# Patient Record
Sex: Male | Born: 1995 | Hispanic: No | Marital: Single | Smoking: Never smoker
Health system: Southern US, Community
[De-identification: ages and names within clinical notes are randomized; demographics above are authoritative.]

## PROBLEM LIST (undated history)

## (undated) DIAGNOSIS — F32A Depression, unspecified: Secondary | ICD-10-CM

## (undated) HISTORY — PX: NO PAST SURGERIES: SHX2092

## (undated) HISTORY — DX: Depression, unspecified: F32.A

---
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
## 2016-04-11 ENCOUNTER — Other Ambulatory Visit: Payer: Self-pay | Admitting: Family Medicine

## 2016-04-11 ENCOUNTER — Ambulatory Visit
Admission: RE | Admit: 2016-04-11 | Discharge: 2016-04-11 | Disposition: A | Discharge status: Home / Self Care | Payer: 59 | Source: Ambulatory Visit | Attending: Family Medicine | Admitting: Family Medicine

## 2016-04-11 DIAGNOSIS — S8991XA Unspecified injury of right lower leg, initial encounter: Secondary | ICD-10-CM

## 2018-06-12 ENCOUNTER — Other Ambulatory Visit: Payer: Self-pay

## 2018-06-12 ENCOUNTER — Encounter (HOSPITAL_BASED_OUTPATIENT_CLINIC_OR_DEPARTMENT_OTHER): Payer: Self-pay | Admitting: Emergency Medicine

## 2018-06-12 ENCOUNTER — Emergency Department (HOSPITAL_BASED_OUTPATIENT_CLINIC_OR_DEPARTMENT_OTHER)
Admission: EM | Admit: 2018-06-12 | Discharge: 2018-06-13 | Disposition: A | Discharge status: Home / Self Care | Payer: Managed Care, Other (non HMO) | Attending: Emergency Medicine | Admitting: Emergency Medicine

## 2018-06-12 DIAGNOSIS — R6889 Other general symptoms and signs: Secondary | ICD-10-CM

## 2018-06-12 DIAGNOSIS — R509 Fever, unspecified: Secondary | ICD-10-CM | POA: Diagnosis present

## 2018-06-12 DIAGNOSIS — J111 Influenza due to unidentified influenza virus with other respiratory manifestations: Secondary | ICD-10-CM | POA: Diagnosis not present

## 2018-06-12 NOTE — ED Triage Notes (Signed)
Pt is c/o fever, chills, shortness of breath, nausea, and abd pain  Pt states he did not feel well Monday and Tuesday but it has gotten worse today  Mother took CBG 78 at home  Pt last took Dayquil at noon today

## 2018-06-13 LAB — COMPREHENSIVE METABOLIC PANEL
ALBUMIN: 4.3 g/dL (ref 3.5–5.0)
ALK PHOS: 44 U/L (ref 38–126)
ALT: 21 U/L (ref 0–44)
AST: 18 U/L (ref 15–41)
Anion gap: 10 (ref 5–15)
BUN: 12 mg/dL (ref 6–20)
CHLORIDE: 102 mmol/L (ref 98–111)
CO2: 24 mmol/L (ref 22–32)
CREATININE: 0.86 mg/dL (ref 0.61–1.24)
Calcium: 8.7 mg/dL — ABNORMAL LOW (ref 8.9–10.3)
GFR calc non Af Amer: >60 mL/min (ref >60)
GLUCOSE: 86 mg/dL (ref 70–99)
Potassium: 3.8 mmol/L (ref 3.5–5.1)
SODIUM: 136 mmol/L (ref 135–145)
Total Bilirubin: 0.5 mg/dL (ref 0.3–1.2)
Total Protein: 7.3 g/dL (ref 6.5–8.1)

## 2018-06-13 LAB — CBC WITH DIFFERENTIAL/PLATELET
Abs Immature Granulocytes: 0.02 10*3/uL (ref 0.00–0.07)
BASOS ABS: 0.1 10*3/uL (ref 0.0–0.1)
Basophils Relative: 1 %
EOS ABS: 0.4 10*3/uL (ref 0.0–0.5)
EOS PCT: 4 %
HCT: 43.6 % (ref 39.0–52.0)
Hemoglobin: 14.1 g/dL (ref 13.0–17.0)
Immature Granulocytes: 0 %
Lymphocytes Relative: 29 %
Lymphs Abs: 2.7 10*3/uL (ref 0.7–4.0)
MCH: 30.8 pg (ref 26.0–34.0)
MCHC: 32.3 g/dL (ref 30.0–36.0)
MCV: 95.2 fL (ref 80.0–100.0)
MONO ABS: 0.7 10*3/uL (ref 0.1–1.0)
Monocytes Relative: 8 %
Neutro Abs: 5.4 10*3/uL (ref 1.7–7.7)
Neutrophils Relative %: 58 %
Platelets: 290 10*3/uL (ref 150–400)
RBC: 4.58 MIL/uL (ref 4.22–5.81)
RDW: 12.5 % (ref 11.5–15.5)
WBC: 9.4 10*3/uL (ref 4.0–10.5)
nRBC: 0 % (ref 0.0–0.2)

## 2018-06-13 LAB — LIPASE, BLOOD: Lipase: 27 U/L (ref 11–51)

## 2018-06-13 MED ORDER — ONDANSETRON HCL 4 MG/2ML IJ SOLN
4.0000 mg | Freq: Once | INTRAMUSCULAR | Status: AC
  Administered 2018-06-13: 4 mg via INTRAVENOUS
  Filled 2018-06-13: qty 2

## 2018-06-13 MED ORDER — KETOROLAC TROMETHAMINE 30 MG/ML IJ SOLN
30.0000 mg | Freq: Once | INTRAMUSCULAR | Status: AC
  Administered 2018-06-13: 30 mg via INTRAVENOUS
  Filled 2018-06-13: qty 1

## 2018-06-13 MED ORDER — SODIUM CHLORIDE 0.9 % IV BOLUS
1000.0000 mL | Freq: Once | INTRAVENOUS | Status: AC
  Administered 2018-06-13: 1000 mL via INTRAVENOUS

## 2018-06-13 NOTE — ED Provider Notes (Signed)
[facility] EMERGENCY DEPARTMENT Provider Note   CSN: [id number] Arrival date & time: 06/12/18  2253     History   Chief Complaint Chief Complaint  Patient presents with  . Chills    HPI Fardin Malson is a 23 y.o. male.  Patient is a 23 year old male with no significant past medical history.  He presents with complaints of fever, chills, and feeling unwell for the past several days.  He describes some upper abdominal discomfort along with nausea, but no vomiting or diarrhea.  He does report some cough that has been nonproductive.  He denies any shortness of breath.  Today he felt chilled and presents for evaluation of this.  He does report one friend who has been ill with somewhat similar symptoms.  The history is provided by the patient.    History reviewed. No pertinent past medical history.  There are no active problems to display for this patient.   History reviewed. No pertinent surgical history.      Home Medications    Prior to Admission medications   Not on File    Family History Family History  Problem Relation Age of Onset  . Diabetes Father   . Diabetes Other   . Hypertension Other   . CAD Other     Social History Social History   Tobacco Use  . Smoking status: Never Smoker  . Smokeless tobacco: Never Used  Substance Use Topics  . Alcohol use: Never    Frequency: Never  . Drug use: Never     Allergies   Patient has no known allergies.   Review of Systems Review of Systems  All other systems reviewed and are negative.    Physical Exam Updated Vital Signs BP (!) 144/94 (BP Location: Left Arm)   Pulse 93   Temp 98.3 F (36.8 C) (Oral)   Resp 16   Ht 5\' 8"  (1.727 m)   Wt 76.9 kg   SpO2 99%   BMI 25.77 kg/m   Physical Exam Vitals signs and nursing note reviewed.  Constitutional:      General: He is not in acute distress.    Appearance: He is well-developed. He is not diaphoretic.  HENT:     Head: Normocephalic  and atraumatic.     Mouth/Throat:     Mouth: Mucous membranes are moist.     Pharynx: No oropharyngeal exudate or posterior oropharyngeal erythema.  Neck:     Musculoskeletal: Normal range of motion and neck supple.  Cardiovascular:     Rate and Rhythm: Normal rate and regular rhythm.     Heart sounds: No murmur. No friction rub.  Pulmonary:     Effort: Pulmonary effort is normal. No respiratory distress.     Breath sounds: Normal breath sounds. No wheezing or rales.  Abdominal:     General: Bowel sounds are normal. There is no distension.     Palpations: Abdomen is soft.     Tenderness: There is no abdominal tenderness.  Musculoskeletal: Normal range of motion.  Skin:    General: Skin is warm and dry.  Neurological:     Mental Status: He is alert and oriented to person, place, and time.     Coordination: Coordination normal.      ED Treatments / Results  Labs (all labs ordered are listed, but only abnormal results are displayed) Labs Reviewed  LIPASE, BLOOD  COMPREHENSIVE METABOLIC PANEL  CBC WITH DIFFERENTIAL/PLATELET    EKG EKG Interpretation  Date/Time:  Friday June 12 2018 23:43:56 EST Ventricular Rate:  113 PR Interval:    QRS Duration: 76 QT Interval:  320 QTC Calculation: 439 R Axis:   100 Text Interpretation:  Right and left arm electrode reversal, interpretation assumes no reversal Sinus tachycardia Borderline right axis deviation LVH by voltage Nonspecific T abnormalities, lateral leads No old tracing to compare Confirmed by Melene Plan [phone]) on 06/13/2018 12:00:10 AM   Radiology No results found.  Procedures Procedures (including critical care time)  Medications Ordered in ED Medications  sodium chloride 0.9 % bolus 1,000 mL (has no administration in time range)  ondansetron (ZOFRAN) injection 4 mg (has no administration in time range)  ketorolac (TORADOL) 30 MG/ML injection 30 mg (has no administration in time range)     Initial  Impression / Assessment and Plan / ED Course  I have reviewed the triage vital signs and the nursing notes.  Pertinent labs & imaging results that were available during my care of the patient were reviewed by me and considered in my medical decision making (see chart for details).  Patient is a 23 year old male presenting with chills and abdominal upset.  He is not actively vomiting or having diarrhea, but I suspect a viral etiology.  His abdomen is benign, he is afebrile, has no white count, and LFTs, electrolytes, and lipase are normal.  Patient will be discharged with rotating Tylenol and ibuprofen and follow-up as needed.  Final Clinical Impressions(s) / ED Diagnoses   Final diagnoses:  None    ED Discharge Orders    None       Geoffery Lyons, MD 06/13/18 [id number]

## 2018-06-13 NOTE — ED Notes (Signed)
Pt understood dc material. NAD noted. All questions answered to satisfaction.  

## 2018-06-13 NOTE — Discharge Instructions (Addendum)
Drink plenty of fluids and get plenty of rest.  Tylenol 1000 mg rotated with Motrin 600 mg every 4 hours as needed for pain or fever.  Return to the emergency department if you develop severe abdominal pain, bloody stools, or other new and concerning symptoms.

## 2018-09-15 NOTE — Progress Notes (Signed)
Virtual Visit via Video Note The purpose of this virtual visit is to provide medical care while limiting exposure to the novel coronavirus.    Consent was obtained for video visit:  Yes.   Answered questions that patient had about telehealth interaction:  Yes.   I discussed the limitations, risks, security and privacy concerns of performing an evaluation and management service by telemedicine. I also discussed with the patient that there may be a patient responsible charge related to this service. The patient expressed understanding and agreed to proceed.  Pt location: Home Physician Location: home Name of referring provider:  Mila PalmerWolters, Sharon, MD I connected with Sergio Meyers at patients initiation/request on 09/17/2018 at  2:00 PM EDT by video enabled telemedicine application and verified that I am speaking with the correct person using two identifiers. Pt MRN:  [id number] Pt DOB:  1996/02/05 Video Participants:  Sergio Meyers;     History of Present Illness:  Patient seen today for the evaluation of tremor.  Primary care notes have been reviewed. How long has it been going on? Several years, worse over the last 2-3 months At rest or with activation?  With activation When is it noted the most?  With coffee  Fam hx of tremor?  No. Located where?  Both hands, worse in the L hand (he is R handed) Affected by caffeine:  Yes.   (doesn't even drink caffeine weekly) Affected by alcohol:  Doesn't drink enough to know Affected by stress:  Yes.   Affected by fatigue:  Yes.   Spills soup if on spoon:  No. Spills glass of liquid if full:  No. Affects ADL's (tying shoes, brushing teeth, etc):  No.     No current outpatient medications on file prior to visit.   No current facility-administered medications on file prior to visit.    History reviewed. No pertinent past medical history.  History reviewed. No pertinent surgical history.  Social History   Socioeconomic History  .  Marital status: Single    Spouse name: Not on file  . Number of children: 0  . Years of education: Not on file  . Highest education level: Bachelor's degree (e.g., BA, AB, BS)  Occupational History  . Not on file  Social Needs  . Financial resource strain: Not on file  . Food insecurity:    Worry: Not on file    Inability: Not on file  . Transportation needs:    Medical: Not on file    Non-medical: Not on file  Tobacco Use  . Smoking status: Never Smoker  . Smokeless tobacco: Never Used  Substance and Sexual Activity  . Alcohol use: Never    Frequency: Never  . Drug use: Never  . Sexual activity: Not on file  Lifestyle  . Physical activity:    Days per week: Not on file    Minutes per session: Not on file  . Stress: Not on file  Relationships  . Social connections:    Talks on phone: Twice a week    Gets together: Never    Attends religious service: Never    Active member of club or organization: No    Attends meetings of clubs or organizations: Never    Relationship status: Never married  . Intimate partner violence:    Fear of current or ex partner: Not on file    Emotionally abused: Not on file    Physically abused: Not on file    Forced sexual activity: Not on  file  Other Topics Concern  . Not on file  Social History Narrative  . Not on file   Family History  Problem Relation Age of Onset  . Diabetes Father   . Hypertension Father   . Diabetes Other   . Hypertension Other   . CAD Other    Review of Systems  Constitutional: Negative.   HENT: Negative.   Eyes: Negative.   Respiratory: Negative.   Cardiovascular: Negative.   Gastrointestinal: Negative.   Genitourinary: Negative.   Musculoskeletal: Negative.   Skin: Negative.   Endo/Heme/Allergies: Negative.   Psychiatric/Behavioral: Negative.     Observations/Objective:   Vitals:   09/17/18 1034  Height: 5\' 7"  (1.702 m)   GEN:  The patient appears stated age and is in NAD.  Neurological  examination:  Orientation: The patient is alert and oriented x3. Cranial nerves: There is good facial symmetry. There is no facial hypomimia.  The speech is fluent and clear. Soft palate rises symmetrically and there is no tongue deviation. Hearing is intact to conversational tone. Motor: Strength is at least antigravity x 4.   Shoulder shrug is equal and symmetric.  There is no pronator drift.  Movement examination: Tone: unable Abnormal movements: There is no rest tremor.  There is postural tremor, much more so on the left than right.  Tremor is evident with Archimedes spirals on the left only. Coordination:  There is no decremation with RAM's Gait and Station: The patient ambulates well.  He is able to heel toe walk.  He is able to stand in the Romberg position.    Chemistry      Component Value Date/Time   NA 136 06/13/2018 0120   K 3.8 06/13/2018 0120   CL 102 06/13/2018 0120   CO2 24 06/13/2018 0120   BUN 12 06/13/2018 0120   CREATININE 0.86 06/13/2018 0120      Component Value Date/Time   CALCIUM 8.7 (L) 06/13/2018 0120   ALKPHOS 44 06/13/2018 0120   AST 18 06/13/2018 0120   ALT 21 06/13/2018 0120   BILITOT 0.5 06/13/2018 0120       Assessment and Plan:   1.  Tremor  -Likely very mild essential tremor.  Patient and I discussed nature and pathophysiology.  We discussed various medications, although discussed with patient that I probably would not take medication at this point in time if I were him.  He agreed and really did not want to take any medication, even on a as needed basis.  Had my office call the primary care physician to see if they had done any labs and they had not.  I would recommend that we do a TSH just to make sure we are not missing anything.  Patient was agreeable.  We will get that scheduled at our office.  -If patient changes his mind in the future, we can consider medications.  Follow Up Instructions:  Follow-up as needed   -I discussed the  assessment and treatment plan with the patient. The patient was provided an opportunity to ask questions and all were answered. The patient agreed with the plan and demonstrated an understanding of the instructions.   The patient was advised to call back or seek an in-person evaluation if the symptoms worsen or if the condition fails to improve as anticipated.    Kerin Salen, DO

## 2018-09-17 ENCOUNTER — Other Ambulatory Visit: Payer: Self-pay

## 2018-09-17 ENCOUNTER — Telehealth (INDEPENDENT_AMBULATORY_CARE_PROVIDER_SITE_OTHER): Payer: Managed Care, Other (non HMO) | Admitting: Neurology

## 2018-09-17 ENCOUNTER — Telehealth: Payer: Self-pay

## 2018-09-17 ENCOUNTER — Other Ambulatory Visit: Payer: Self-pay | Admitting: Neurology

## 2018-09-17 ENCOUNTER — Encounter: Payer: Self-pay | Admitting: Neurology

## 2018-09-17 VITALS — Ht 67.0 in

## 2018-09-17 DIAGNOSIS — R6889 Other general symptoms and signs: Secondary | ICD-10-CM

## 2018-09-17 DIAGNOSIS — G250 Essential tremor: Secondary | ICD-10-CM

## 2018-09-17 DIAGNOSIS — R251 Tremor, unspecified: Secondary | ICD-10-CM

## 2018-09-17 NOTE — Telephone Encounter (Signed)
Called patient to go medical history, medication, for new patient visit today. No answer left message to call office back

## 2018-09-17 NOTE — Addendum Note (Signed)
Addended by: Lynwood Dawley on: 09/17/2018 02:03 PM   Modules accepted: Orders

## 2018-09-18 LAB — TSH: TSH: 0.018 u[IU]/mL — ABNORMAL LOW (ref 0.450–4.500)

## 2018-09-22 ENCOUNTER — Telehealth: Payer: Self-pay

## 2018-09-22 ENCOUNTER — Telehealth: Payer: Self-pay | Admitting: Neurology

## 2018-09-22 NOTE — Telephone Encounter (Signed)
Called spoke with patient he was made aware and will follow up with PCP

## 2018-09-22 NOTE — Telephone Encounter (Signed)
Called patient no answer Left message to call office back to discuss lab results.

## 2018-09-22 NOTE — Telephone Encounter (Signed)
-----   Message from Octaviano Batty Tat, DO sent at 09/22/2018  8:11 AM EDT ----- Let pt know that his TSH is really low, which could indicate hyperthyroidism, and may be the reason he has that tremor.  More blood testing will likely need to be done with PCP.  Please let pt know and fax copy to the PCP.  Ask patient to make an appt with the PCP to follow up

## 2018-09-22 NOTE — Telephone Encounter (Signed)
Pt needs the lab results please call him with the results

## 2018-09-23 NOTE — Telephone Encounter (Signed)
Spoke with patient yesterday he was given results, see other note

## 2018-10-09 ENCOUNTER — Ambulatory Visit: Payer: Managed Care, Other (non HMO) | Admitting: Family Medicine

## 2018-10-09 ENCOUNTER — Encounter: Payer: Self-pay | Admitting: Family Medicine

## 2018-10-09 VITALS — BP 120/72 | HR 88 | Temp 97.9°F | Ht 67.0 in | Wt 157.0 lb

## 2018-10-09 DIAGNOSIS — G250 Essential tremor: Secondary | ICD-10-CM | POA: Diagnosis not present

## 2018-10-09 DIAGNOSIS — Z7689 Persons encountering health services in other specified circumstances: Secondary | ICD-10-CM

## 2018-10-09 DIAGNOSIS — E049 Nontoxic goiter, unspecified: Secondary | ICD-10-CM | POA: Diagnosis not present

## 2018-10-09 DIAGNOSIS — R7989 Other specified abnormal findings of blood chemistry: Secondary | ICD-10-CM

## 2018-10-09 NOTE — Progress Notes (Signed)
Sergio GrillsJulioRae Meyers is a 23 y.o. male  Chief Complaint  Patient presents with  . Establish Care    est care/ low TSH wants     HPI: Sergio Meyers is a 23 y.o. male here to establish care with our office and to f/u on recent lab test done by neurologist Dr. Lurena Joinerebecca Tat. Pt saw neuro for eval of B/L hand tremor. Dr. Arbutus Leasat diagnosed a mild essential tremor but checked TSH which was found to be low (TSH - 0.018). it was recommended pt f/u with PCP for further eval and treatment.   Lab Results  Component Value Date   TSH 0.018 (L) 09/17/2018   Pt denies palpitations, anxiety, intolerance to heat/cold, vision changes. He does endorse difficulty falling asleep on/off x over 1 year.  Fam hx of thyroid dysfunction - none  Specialists: neuro (Dr. Arbutus Leasat with [facility])    No past medical history on file.  No past surgical history on file.  Social History   Socioeconomic History  . Marital status: Single    Spouse name: Not on file  . Number of children: 0  . Years of education: Not on file  . Highest education level: Bachelor's degree (e.g., BA, AB, BS)  Occupational History  . Not on file  Social Needs  . Financial resource strain: Not on file  . Food insecurity    Worry: Not on file    Inability: Not on file  . Transportation needs    Medical: Not on file    Non-medical: Not on file  Tobacco Use  . Smoking status: Never Smoker  . Smokeless tobacco: Never Used  Substance and Sexual Activity  . Alcohol use: Never    Frequency: Never  . Drug use: Never  . Sexual activity: Not on file  Lifestyle  . Physical activity    Days per week: Not on file    Minutes per session: Not on file  . Stress: Not on file  Relationships  . Social Musicianconnections    Talks on phone: Twice a week    Gets together: Never    Attends religious service: Never    Active member of club or organization: No    Attends meetings of clubs or organizations: Never    Relationship status: Never married   . Intimate partner violence    Fear of current or ex partner: Not on file    Emotionally abused: Not on file    Physically abused: Not on file    Forced sexual activity: Not on file  Other Topics Concern  . Not on file  Social History Narrative  . Not on file    Family History  Problem Relation Age of Onset  . Diabetes Father   . Hypertension Father   . Diabetes Other   . Hypertension Other   . CAD Other      Immunization History  Administered Date(s) Administered  . Influenza Inj Mdck Quad Pf 04/05/2018    No outpatient encounter medications on file as of 10/09/2018.   No facility-administered encounter medications on file as of 10/09/2018.      ROS: Gen: no fever, chills  Skin: no rash, itching Eyes: no blurry vision, double vision Resp: no cough, wheeze,SOB CV: no CP, palpitations, LE edema,  GI: no heartburn, n/v/d/c, abd pain GU: no dysuria, urgency, frequency, hematuria  MSK: no joint pain, myalgias, back pain Neuro: no dizziness, headache, weakness, + B/L hand tremor x years Psych: no depression, anxiety, insomnia  No Known Allergies  BP 120/72   Pulse 88   Temp 97.9 F (36.6 C) (Oral)   Ht 5\' 7"  (1.702 m)   Wt 157 lb (71.2 kg)   SpO2 98%   BMI 24.59 kg/m   Physical Exam  Constitutional: He appears well-developed and well-nourished. No distress.  Neck: Neck supple. No JVD present. No tracheal deviation present. Thyromegaly present.  Thyroid feels large, no discrete nodules palpated  Cardiovascular: Normal rate, regular rhythm and normal heart sounds.  No murmur heard. Pulmonary/Chest: Effort normal and breath sounds normal. He has no wheezes. He has no rhonchi.  Lymphadenopathy:    He has no cervical adenopathy.  Psychiatric: He has a normal mood and affect. His behavior is normal.    A/P:  1. Encounter to establish care with new doctor  2. Benign essential tremor - evaluated by neurology in 08/2018 - could very likely be d/t  hyperthyroidism - see below #3-4  3. Low serum thyroid stimulating hormone (TSH) 4. Enlarged thyroid - T4, free - TSH - T3 - Thyroid peroxidase antibody - Thyroid stimulating immunoglobulin - US THYROID; Future - will contact pt once results available and determine treatment plan  Discussed plan and reviewed medications with patient, including risks, benefits, and potential side effects. Pt expressed understand. All questions answered.

## 2018-10-09 NOTE — Patient Instructions (Addendum)
[facility] [place], [place], [place] [address] [phone]

## 2018-10-12 LAB — T4, FREE: Free T4: 2.67 ng/dL — ABNORMAL HIGH (ref 0.82–1.77)

## 2018-10-12 LAB — THYROID STIMULATING IMMUNOGLOBULIN: Thyroid Stim Immunoglobulin: <0.1 IU/L (ref 0.00–0.55)

## 2018-10-12 LAB — TSH: TSH: <0.006 u[IU]/mL — ABNORMAL LOW (ref 0.450–4.500)

## 2018-10-12 LAB — T3: T3, Total: 195 ng/dL — ABNORMAL HIGH (ref 71–180)

## 2018-10-12 LAB — THYROID PEROXIDASE ANTIBODY: Thyroperoxidase Ab SerPl-aCnc: 35 IU/mL — ABNORMAL HIGH (ref 0–34)

## 2018-10-14 ENCOUNTER — Other Ambulatory Visit: Payer: Self-pay | Admitting: Family Medicine

## 2018-10-14 DIAGNOSIS — E0590 Thyrotoxicosis, unspecified without thyrotoxic crisis or storm: Secondary | ICD-10-CM

## 2018-10-19 ENCOUNTER — Ambulatory Visit (HOSPITAL_BASED_OUTPATIENT_CLINIC_OR_DEPARTMENT_OTHER)
Admission: RE | Admit: 2018-10-19 | Discharge: 2018-10-19 | Disposition: A | Discharge status: Home / Self Care | Payer: Managed Care, Other (non HMO) | Source: Ambulatory Visit | Attending: Family Medicine | Admitting: Family Medicine

## 2018-10-19 ENCOUNTER — Other Ambulatory Visit: Payer: Self-pay

## 2018-10-19 DIAGNOSIS — R7989 Other specified abnormal findings of blood chemistry: Secondary | ICD-10-CM | POA: Insufficient documentation

## 2018-10-19 DIAGNOSIS — E049 Nontoxic goiter, unspecified: Secondary | ICD-10-CM

## 2018-12-02 ENCOUNTER — Ambulatory Visit (INDEPENDENT_AMBULATORY_CARE_PROVIDER_SITE_OTHER): Payer: 59 | Admitting: Internal Medicine

## 2018-12-02 ENCOUNTER — Encounter: Payer: Self-pay | Admitting: Internal Medicine

## 2018-12-02 ENCOUNTER — Other Ambulatory Visit: Payer: Self-pay

## 2018-12-02 VITALS — BP 118/68 | HR 60 | Temp 98.0°F | Ht 67.0 in | Wt 151.6 lb

## 2018-12-02 DIAGNOSIS — E0590 Thyrotoxicosis, unspecified without thyrotoxic crisis or storm: Secondary | ICD-10-CM

## 2018-12-02 LAB — T4, FREE: Free T4: 0.82 ng/dL (ref 0.60–1.60)

## 2018-12-02 LAB — TSH: TSH: 2.34 u[IU]/mL (ref 0.35–4.50)

## 2018-12-02 NOTE — Patient Instructions (Signed)
-   Stop by the lab today, will contact you with the results  

## 2018-12-02 NOTE — Progress Notes (Signed)
Name: Sergio Meyers  MRN/ DOB: [id number], [birth year]    Age/ Sex: 23 y.o., male    PCP: Overton Mamirigliano, Mary K, DO   Reason for Endocrinology Evaluation: Hyperthyroidism     Date of Initial Endocrinology Evaluation: 12/02/2018     HPI: [name] is a 23 y.o. male with unremarkable past medical history . The patient presented for initial endocrinology clinic visit on 12/02/2018 for consultative assistance with his Hyperthyroidism  Pt was noted to have a low TSH at  0.018 uIU/mL  , during evaluation of worsening of chronic tremors in 08/2018. Repeat labs in June, 2020 showed suppressed TSH < 0.006 uIU/mL with elevated FT4 and T3.   Today his tremors are stable.  He denies weight loss, palpitations, diarrhea , anxiety and jittery sensation.   Denies local neck symptoms.    No biotin   Of note the pt presented to the ED in 05/2018 with fever, and chills.      No FH of thyroid disease    HISTORY:   Past Medical History: No past medical history on file.  Past Surgical History: No past surgical history on file.   Social History:  reports that he has never smoked. He has never used smokeless tobacco. He reports that he does not drink alcohol or use drugs.  Family History: family history includes CAD in an other family member; Diabetes in his father and another family member; Hypertension in his father and another family member.   HOME MEDICATIONS: Allergies as of 12/02/2018   No Known Allergies     Medication List    as of December 02, 2018 10:01 AM   You have not been prescribed any medications.       REVIEW OF SYSTEMS: A comprehensive ROS was conducted with the patient and is negative except as per HPI and below:  ROS     OBJECTIVE:  VS: BP 118/68 (BP Location: Left Arm, Patient Position: Sitting, Cuff Size: Normal)   Pulse 60   Temp 98 F (36.7 C)   Ht 5\' 7"  (1.702 m)   Wt 151 lb 9.6 oz (68.8 kg)   SpO2 99%   BMI 23.74 kg/m    Wt Readings from  Last 3 Encounters:  12/02/18 151 lb 9.6 oz (68.8 kg)  10/09/18 157 lb (71.2 kg)  06/12/18 169 lb 8 oz (76.9 kg)     EXAM: General: Pt appears well and is in NAD  Hydration: Well-hydrated with moist mucous membranes and good skin turgor  Eyes: External eye exam normal without stare, lid lag or exophthalmos.  EOM intact.   Ears, Nose, Throat: Hearing: Grossly intact bilaterally Dental: Good dentition  Throat: Clear without mass, erythema or exudate  Neck: General: Supple without adenopathy. Thyroid: Thyroid size normal.  No goiter or nodules appreciated. No thyroid bruit.  Lungs: Clear with good BS bilat with no rales, rhonchi, or wheezes  Heart: Auscultation: RRR.  Abdomen: Normoactive bowel sounds, soft, nontender, without masses or organomegaly palpable  Extremities:  BL LE: No pretibial edema normal ROM and strength.  Skin: Hair: Texture and amount normal with gender appropriate distribution Skin Inspection: No rashes, acanthosis nigricans/skin tags. No lipohypertrophy Skin Palpation: Skin temperature, texture, and thickness normal to palpation  Neuro: Cranial nerves: II - XII grossly intact  Motor: Normal strength throughout DTRs: 2+ and symmetric in UE without delay in relaxation phase  Mental Status: Judgment, insight: Intact Orientation: Oriented to time, place, and person Mood and affect: No depression,  anxiety, or agitation     DATA REVIEWED:   Results for Sergio Meyers, Sergio Meyers (MRN [id number]) as of 12/04/2018 09:05  Ref. Range 09/17/2018 15:49 10/09/2018 10:59 12/02/2018 10:09  TSH Latest Ref Range: 0.35 - 4.50 uIU/mL 0.018 (L) <0.006 (L) 2.34  Triiodothyronine (T3) Latest Ref Range: 76 - 181 ng/dL  195 (H) 105  T4,Free(Direct) Latest Ref Range: 0.60 - 1.60 ng/dL  2.67 (H) 0.82  Thyroperoxidase Ab SerPl-aCnc Latest Ref Range: <9 IU/mL  35 (H) 56 (H)  THYROID STIMULATING IMMUNOGLOBULIN Unknown  Rpt      TSI  Negative   Thyroid Ultrasound (10/19/2018)  Mildly  enlarged and heterogeneous thyroid gland suggestive of underlying thyroiditis. Small hypoechoic area along the anterior aspect of the right isthmus is not concerning, as above.  Old records , labs and images have been reviewed.   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  - Repeat TFT's are normal, this is most consistent with thyroiditis secondary to the viral illness he had 2 months prior to his presentation.    2. Hashimoto's Disease:  - His Anti-TPO were  Elevated which is sometimes a normal occurrence with any form of inflmmation, repeat Anti-TPO level has confirmed persistent elevation. I would recommend annual TFT's at his PCP's office from now on.      F/U PRN   Signed electronically by: Mack Guise, MD  [facility] Endocrinology  [facility] [address] [place], [place] [address] Phone: [phone] FAX: [phone]   CC: [address], [place] [address] Phone: [phone] Fax: [phone]   Return to Endocrinology clinic as below: No future appointments.

## 2018-12-05 LAB — T3: T3, Total: 105 ng/dL (ref 76–181)

## 2018-12-05 LAB — THYROID PEROXIDASE ANTIBODY: Thyroperoxidase Ab SerPl-aCnc: 56 IU/mL — ABNORMAL HIGH (ref <9)

## 2018-12-05 LAB — TRAB (TSH RECEPTOR BINDING ANTIBODY): TRAB: 1.32 IU/L (ref <=2.00)

## 2019-01-16 ENCOUNTER — Encounter: Payer: Self-pay | Admitting: Family Medicine

## 2019-01-29 ENCOUNTER — Encounter: Payer: Self-pay | Admitting: Family Medicine

## 2019-01-29 ENCOUNTER — Ambulatory Visit (INDEPENDENT_AMBULATORY_CARE_PROVIDER_SITE_OTHER): Payer: 59 | Admitting: Family Medicine

## 2019-01-29 ENCOUNTER — Other Ambulatory Visit: Payer: Self-pay

## 2019-01-29 VITALS — BP 122/68 | HR 79 | Temp 98.2°F | Wt 151.0 lb

## 2019-01-29 DIAGNOSIS — R29898 Other symptoms and signs involving the musculoskeletal system: Secondary | ICD-10-CM

## 2019-01-29 DIAGNOSIS — Z23 Encounter for immunization: Secondary | ICD-10-CM | POA: Diagnosis not present

## 2019-01-29 NOTE — Patient Instructions (Signed)
Call [place] neurology Dr. Doristine Devoid office to schedule appt -  [phone]

## 2019-01-29 NOTE — Progress Notes (Signed)
Sergio Meyers is a 23 y.o. male  Chief Complaint  Patient presents with  . Foot Problem    Pt is here today C/O left foot drop. Denies any back issues. He has to pay close attention while walking so he does not have issues walking. States that this has been an intermittent issue since late high-school/early college. Denies any H/O injury.     HPI: Sergio Meyers is a 23 y.o. male who complains of intermittent left "foot drop" occurring on/off for about 5 years since late high school or early college. Symptoms not getting worse or more frequent. They occur every 3-6 months, maybe less frequent. Symptoms last "a few hours" with longest being 12hrs. He is able to plantar flex. He is unsure if he is able to extend his toes or evert his ankle during these episodes. He is able to walk during these times but has to "be careful". No falls. He can not associate symptoms with anything - time of day, level of activity, etc. No pain, swelling, redness.  Pt denies any injury or trauma. Pt denies back pain. Pt denies HA, vision changes, numbness or tingling, dizziness, changes in bowel or bladder function. Pt denies lower extremity weakness, edema or pain. Pt denies fever, chills, CP, SOB, n/v/d/c. Pt does not have any known/diagnosed neurologic conditions. No fam h/o any neurologic issues.  No surgical or anesthesia history.  Pt works for [employer] so is on his feet and active but he does not associate above symptom with work, increased work hours, etc. Above symptom has never occurred while at work .  Pt would like a flu shot today.  History reviewed. No pertinent past medical history.  History reviewed. No pertinent surgical history.  Social History   Socioeconomic History  . Marital status: Single    Spouse name: Not on file  . Number of children: 0  . Years of education: Not on file  . Highest education level: Bachelor's degree (e.g., BA, AB, BS)  Occupational History  .  Not on file  Social Needs  . Financial resource strain: Not on file  . Food insecurity    Worry: Not on file    Inability: Not on file  . Transportation needs    Medical: Not on file    Non-medical: Not on file  Tobacco Use  . Smoking status: Never Smoker  . Smokeless tobacco: Never Used  Substance and Sexual Activity  . Alcohol use: Never    Frequency: Never  . Drug use: Never  . Sexual activity: Not on file  Lifestyle  . Physical activity    Days per week: Not on file    Minutes per session: Not on file  . Stress: Not on file  Relationships  . Social Herbalist on phone: Twice a week    Gets together: Never    Attends religious service: Never    Active member of club or organization: No    Attends meetings of clubs or organizations: Never    Relationship status: Never married  . Intimate partner violence    Fear of current or ex partner: Not on file    Emotionally abused: Not on file    Physically abused: Not on file    Forced sexual activity: Not on file  Other Topics Concern  . Not on file  Social History Narrative  . Not on file    Family History  Problem Relation Age of Onset  .  Diabetes Father   . Hypertension Father   . Diabetes Other   . Hypertension Other   . CAD Other      Immunization History  Administered Date(s) Administered  . Influenza Inj Mdck Quad Pf 04/05/2018    No outpatient encounter medications on file as of 01/29/2019.   No facility-administered encounter medications on file as of 01/29/2019.      ROS: Pertinent positives and negatives noted in HPI. Remainder of ROS non-contributory   No Known Allergies  BP 122/68 (BP Location: Left Arm, Patient Position: Sitting, Cuff Size: Normal)   Pulse 79   Temp 98.2 F (36.8 C) (Oral)   Wt 151 lb (68.5 kg)   SpO2 98%   BMI 23.65 kg/m   Physical Exam  Constitutional: He is oriented to person, place, and time. He appears well-developed and well-nourished. No distress.   Eyes: Pupils are equal, round, and reactive to light. EOM are normal.  Musculoskeletal: Normal range of motion.        General: No tenderness, deformity or edema.  Neurological: He is alert and oriented to person, place, and time. He has normal strength and normal reflexes. No sensory deficit. Coordination and gait normal.  Skin: Skin is warm and dry. No rash noted. No erythema.  Psychiatric: He has a normal mood and affect. His behavior is normal.    A/P:  1. Need for influenza vaccination - Flu Vaccine QUAD 6+ mos PF IM (Fluarix Quad PF)  2. Weakness of foot, left - pt with approximately 5 year h/o intermittent (occurring every 3-6+ mo) inability to dorsiflex his left foot. No pain, swelling, numbness, paresthesias, back pain. No injury, trauma, previously diagnosed neurologic condition. - normal exam in office today - pt is concerned, wonders about imaging, seeing neuro - will refer pt to neuro (pt has previously seen neuro Dr. Arbutus Leas for tremor in 08/2018) so pt will call her office to schedule and I will cc her on this encounter  I personally spent 25 min with the patient today and greater than 50% was spent in counseling, coordination of care, education

## 2019-05-08 ENCOUNTER — Encounter: Payer: Self-pay | Admitting: Family Medicine

## 2019-09-06 ENCOUNTER — Encounter: Payer: Self-pay | Admitting: Family Medicine

## 2019-09-07 NOTE — Telephone Encounter (Signed)
Please see message and advise.  Thank you. ° °

## 2019-09-10 ENCOUNTER — Telehealth (INDEPENDENT_AMBULATORY_CARE_PROVIDER_SITE_OTHER): Payer: 59 | Admitting: Family Medicine

## 2019-09-10 ENCOUNTER — Encounter: Payer: Self-pay | Admitting: Family Medicine

## 2019-09-10 VITALS — Temp 97.8°F | Ht 67.0 in

## 2019-09-10 DIAGNOSIS — R5383 Other fatigue: Secondary | ICD-10-CM

## 2019-09-10 NOTE — Patient Instructions (Signed)
Health Maintenance Due  Topic Date Due  . TETANUS/TDAP  06/26/2016    Depression screen PHQ 2/9 01/29/2019  Decreased Interest 0  Down, Depressed, Hopeless 0  PHQ - 2 Score 0

## 2019-09-10 NOTE — Progress Notes (Signed)
Virtual Visit via Video Note  I connected with Sergio Meyers Name on 09/10/19 at  4:00 PM EDT by a video enabled telemedicine application and verified that I am speaking with the correct person using two identifiers. Location patient: home Location provider: work Persons participating in the virtual visit: patient, provider  I discussed the limitations of evaluation and management by telemedicine and the availability of in person appointments. The patient expressed understanding and agreed to proceed.  Chief Complaint  Patient presents with  . Fatigue    Pt c/o feeling tired all the time x1.5.      HPI: Sergio Meyers is a 24 y.o. male who complains of fatigued/tired x at least 1.5 mo. Pt notes issues falling asleep, not staying asleep. He tries to go to bed 11-11:30pm and wakes up at 9am. He typically lays in bed for 1-3 hrs before falling asleep. This happens on/off x years and "comes and goes in waves". Not new in the pat 1.76mo. He has tried melatonin and z-quil. He had about 1 week of decreased appetite, improving in past few days.  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  No new or increased stress.  Depression screen [facility] 2/9 09/10/2019 01/29/2019  Decreased Interest 1 0  Down, Depressed, Hopeless 0 0  PHQ - 2 Score 1 0     History reviewed. No pertinent past medical history.  History reviewed. No pertinent surgical history.  Family History  Problem Relation Age of Onset  . Diabetes Father   . Hypertension Father   . Diabetes Other   . Hypertension Other   . CAD Other     Social History   Tobacco Use  . Smoking status: Never Smoker  . Smokeless tobacco: Never Used  Substance Use Topics  . Alcohol use: Never  . Drug use: Never    No current outpatient medications on file.  No Known Allergies    ROS: See pertinent positives and negatives per HPI.   EXAM:  VITALS  per patient if applicable: Temp 97.8 F (36.6 C) (Oral)   Ht 5\' 7"  (1.702 m)   BMI 23.65 kg/m    GENERAL: alert, oriented, appears well and in no acute distress  HEENT: atraumatic, conjunctiva clear, no obvious abnormalities on inspection of external nose and ears  NECK: normal movements of the head and neck  LUNGS: on inspection no signs of respiratory distress, breathing rate appears normal, no obvious gross SOB, gasping or wheezing, no conversational dyspnea  CV: no obvious cyanosis  PSYCH/NEURO: pleasant and cooperative, speech and thought processing grossly intact   ASSESSMENT AND PLAN: 1. Fatigue, unspecified type - denies depression, PHQ-2 = 1 - trouble falling asleep may be contributing but pt states this is not consistent even in the pat 1.86mo when his fatigue has increased - counseled pt on the importance of healthy, well-balanced diet and regular CV exercise - pt declines any Rx med for sleep - will check labs (appt Mon 5/17 at 8am) - CBC; Future - TSH; Future - T4, free; Future - VITAMIN D 25 Hydroxy (Vit-D Deficiency, Fractures); Future - Vitamin B12; Future     I discussed the assessment and treatment plan with the patient. The patient was provided an opportunity to ask questions and all were answered. The patient agreed with the plan and demonstrated an understanding of the instructions.   The patient was advised to call back or seek an in-person  evaluation if the symptoms worsen or if the condition fails to improve as anticipated.   Luana Shu, DO

## 2019-09-13 ENCOUNTER — Other Ambulatory Visit: Payer: Self-pay

## 2019-09-13 ENCOUNTER — Other Ambulatory Visit (INDEPENDENT_AMBULATORY_CARE_PROVIDER_SITE_OTHER): Payer: 59

## 2019-09-13 DIAGNOSIS — R5383 Other fatigue: Secondary | ICD-10-CM

## 2019-09-14 LAB — CBC
Hematocrit: 44.6 % (ref 37.5–51.0)
Hemoglobin: 14.9 g/dL (ref 13.0–17.7)
MCH: 31.4 pg (ref 26.6–33.0)
MCHC: 33.4 g/dL (ref 31.5–35.7)
MCV: 94 fL (ref 79–97)
Platelets: 322 10*3/uL (ref 150–450)
RBC: 4.74 x10E6/uL (ref 4.14–5.80)
RDW: 12.5 % (ref 11.6–15.4)
WBC: 7.9 10*3/uL (ref 3.4–10.8)

## 2019-09-14 LAB — VITAMIN B12: Vitamin B-12: 429 pg/mL (ref 232–1245)

## 2019-09-14 LAB — T4, FREE: Free T4: 1.36 ng/dL (ref 0.82–1.77)

## 2019-09-14 LAB — VITAMIN D 25 HYDROXY (VIT D DEFICIENCY, FRACTURES): Vit D, 25-Hydroxy: 13 ng/mL — ABNORMAL LOW (ref 30.0–100.0)

## 2019-09-14 LAB — TSH: TSH: 4.74 u[IU]/mL — ABNORMAL HIGH (ref 0.450–4.500)

## 2019-09-15 ENCOUNTER — Other Ambulatory Visit: Payer: Self-pay | Admitting: Family Medicine

## 2019-09-15 ENCOUNTER — Encounter: Payer: Self-pay | Admitting: Family Medicine

## 2019-09-15 DIAGNOSIS — E559 Vitamin D deficiency, unspecified: Secondary | ICD-10-CM

## 2019-09-15 DIAGNOSIS — R7989 Other specified abnormal findings of blood chemistry: Secondary | ICD-10-CM

## 2019-09-15 MED ORDER — VITAMIN D (ERGOCALCIFEROL) 1.25 MG (50000 UNIT) PO CAPS: 50000.0000 [IU] | ORAL_CAPSULE | ORAL | 2 refills | Status: DC

## 2019-10-06 ENCOUNTER — Encounter: Payer: Self-pay | Admitting: Family Medicine

## 2019-11-08 ENCOUNTER — Encounter: Payer: Self-pay | Admitting: Family Medicine

## 2019-11-16 ENCOUNTER — Other Ambulatory Visit: Payer: Self-pay

## 2019-11-16 NOTE — Patient Instructions (Addendum)
Health Maintenance Due  Topic Date Due  . Hepatitis C Screening  Never done  . COVID-19 Vaccine (1) Never done  . TETANUS/TDAP  06/26/2016    Depression screen PHQ 2/9 09/10/2019 01/29/2019  Decreased Interest 1 0  Down, Depressed, Hopeless 0 0  PHQ - 2 Score 1 0    [facility]: [email or web address]  [email or web address] [email or web address]  [email or web address]  [facility] [email or web address]  [email or web address]

## 2019-11-17 ENCOUNTER — Encounter: Payer: Self-pay | Admitting: Family Medicine

## 2019-11-17 ENCOUNTER — Ambulatory Visit: Payer: Managed Care, Other (non HMO) | Admitting: Family Medicine

## 2019-11-17 VITALS — BP 118/70 | HR 88 | Temp 98.3°F | Ht 67.0 in | Wt 148.0 lb

## 2019-11-17 DIAGNOSIS — R5383 Other fatigue: Secondary | ICD-10-CM

## 2019-11-17 DIAGNOSIS — R7989 Other specified abnormal findings of blood chemistry: Secondary | ICD-10-CM | POA: Diagnosis not present

## 2019-11-17 DIAGNOSIS — F321 Major depressive disorder, single episode, moderate: Secondary | ICD-10-CM

## 2019-11-17 MED ORDER — FLUOXETINE HCL 10 MG PO TABS: ORAL_TABLET | ORAL | 2 refills | Status: DC

## 2019-11-17 NOTE — Addendum Note (Signed)
Addended by: Varney Biles on: 11/17/2019 11:56 AM   Modules accepted: Orders

## 2019-11-17 NOTE — Progress Notes (Signed)
Sergio Meyers is a 24 y.o. male  Chief Complaint  Patient presents with  . Fatigue    Pt c/o lack of appetite x 1 week and feeling tired/fatigue x 2 week.    HPI: Sergio Meyers is a 24 y.o. male complains of fatigue and decreased appetite. Symptoms have become consistent/chronic but worse in the past 1-2 wks. He also notes being more easily irritated - both at work and in his personal life. He states at times he feels lonely but is then not receptive to invitations to do anything social.  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   Pt has been seen by me in the past, about 2 mo ago, for same/similar symptoms. See notes from 09/10/19 encounter. Pt notes intermittent periods of fatigue and decreased appetite x months. He was having trouble falling asleep. He is currently taking melatonin (unsure of dose) qhs and is sleeping well.   His Vit D was low in 08/2019 and he was Rx'd Vit D 50,000IU weekly x 12 wks. He is taking this.  He is also due for repeat TFTs (order placed previously).  Last vitamin D Lab Results  Component Value Date   VD25OH 13.0 (L) 09/13/2019   Lab Results  Component Value Date   VITAMINB12 429 09/13/2019   Lab Results  Component Value Date   WBC 7.9 09/13/2019   HGB 14.9 09/13/2019   HCT 44.6 09/13/2019   MCV 94 09/13/2019   PLT 322 09/13/2019   Lab Results  Component Value Date   TSH 4.740 (H) 09/13/2019   T3TOTAL 105 12/02/2018   Depression screen PHQ 2/9 09/10/2019 01/29/2019  Decreased Interest 1 0  Down, Depressed, Hopeless 0 0  PHQ - 2 Score 1 0   No flowsheet data found.   History reviewed. No pertinent past medical history.  History reviewed. No pertinent surgical history.  Social History   Socioeconomic History  . Marital status: Single    Spouse name: Not on file  . Number of children: 0  . Years of education: Not on file  . Highest education level:  Bachelor's degree (e.g., BA, AB, BS)  Occupational History  . Not on file  Tobacco Use  . Smoking status: Never Smoker  . Smokeless tobacco: Never Used  Vaping Use  . Vaping Use: Never used  Substance and Sexual Activity  . Alcohol use: Never  . Drug use: Never  . Sexual activity: Not on file  Other Topics Concern  . Not on file  Social History Narrative  . Not on file   Social Determinants of Health   Financial Resource Strain:   . Difficulty of Paying Living Expenses:   Food Insecurity:   . Worried About Charity fundraiser in the Last Year:   . Arboriculturist in the Last Year:   Transportation Needs:   . Film/video editor (Medical):   Marland Kitchen Lack of Transportation (Non-Medical):   Physical Activity:   . Days of Exercise per Week:   . Minutes of Exercise per Session:   Stress:   . Feeling of Stress :   Social Connections:   . Frequency of Communication with Friends and Family:   . Frequency of Social Gatherings with Friends and Family:   . Attends Religious Services:   . Active Member of Clubs or Organizations:   . Attends Archivist Meetings:   .  Marital Status:   Intimate Partner Violence:   . Fear of Current or Ex-Partner:   . Emotionally Abused:   Marland Kitchen Physically Abused:   . Sexually Abused:     Family History  Problem Relation Age of Onset  . Diabetes Father   . Hypertension Father   . Diabetes Other   . Hypertension Other   . CAD Other      Immunization History  Administered Date(s) Administered  . DTaP 05/14/1995, 07/19/1995, 09/10/1995, 09/17/1996, 04/02/2000  . Hepatitis A 12/16/2006  . Hepatitis B 08/01/1994, 05/14/1995, 06/04/1996  . HiB (PRP-OMP) 05/14/1995, 09/10/1995, 06/04/1996, 07/18/1996  . IPV 05/14/1995, 07/19/1995, 09/17/1996, 04/02/2000  . Influenza Inj Mdck Quad Pf 04/05/2018  . Influenza,inj,Quad PF,6+ Mos 01/29/2019  . Influenza-Unspecified 02/28/2006  . MMR 03/12/1996, 03/13/1999  . Tdap 06/27/2006  . Varicella  03/12/1996    Outpatient Encounter Medications as of 11/17/2019  Medication Sig  . Vitamin D, Ergocalciferol, (DRISDOL) 1.25 MG (50000 UNIT) CAPS capsule Take 1 capsule (50,000 Units total) by mouth every 7 (seven) days.  Marland Kitchen FLUoxetine (PROZAC) 10 MG tablet Take 1/2 tab daily x 1 week then increase to 1 tab daily   No facility-administered encounter medications on file as of 11/17/2019.     ROS: Pertinent positives and negatives noted in HPI. Remainder of ROS non-contributory    No Known Allergies  BP 118/70 (BP Location: Left Arm, Patient Position: Sitting, Cuff Size: Normal)   Pulse 88   Temp 98.3 F (36.8 C) (Temporal)   Ht '5\' 7"'  (1.702 m)   Wt 148 lb (67.1 kg)   SpO2 97%   BMI 23.18 kg/m   Physical Exam Constitutional:      General: He is not in acute distress.    Appearance: Normal appearance. He is not ill-appearing.  Pulmonary:     Effort: No respiratory distress.  Neurological:     Mental Status: He is alert and oriented to person, place, and time.  Psychiatric:        Attention and Perception: Attention normal.        Mood and Affect: Affect is flat.        Behavior: Behavior normal.      A/P:  1. Fatigue, unspecified type 2. Depression, major, single episode, moderate (HCC) - PHQ-9 = 16 (moderately severe) - included in AVS info re: [place] [facility] Rx: - FLUoxetine (PROZAC) 10 MG tablet; Take 1/2 tab daily x 1 week then increase to 1 tab daily  Dispense: 30 tablet; Refill: 2 - f/u in 3-4 wks or sooner PRN Discussed plan and reviewed medications with patient, including risks, benefits, and potential side effects. Pt expressed understand. All questions answered.  3. Elevated TSH - Thyroid peroxidase antibody - T4, free - TSH   This visit occurred during the SARS-CoV-2 public health emergency.  Safety protocols were in place, including screening questions prior to the visit, additional usage of staff PPE, and extensive cleaning of exam room while  observing appropriate contact time as indicated for disinfecting solutions.

## 2019-11-18 LAB — TSH: TSH: 1.18 u[IU]/mL (ref 0.450–4.500)

## 2019-11-18 LAB — T4, FREE: Free T4: 1.43 ng/dL (ref 0.82–1.77)

## 2019-11-18 LAB — THYROID PEROXIDASE ANTIBODY: Thyroperoxidase Ab SerPl-aCnc: <8 IU/mL (ref 0–34)

## 2019-12-07 ENCOUNTER — Other Ambulatory Visit: Payer: Self-pay

## 2019-12-07 ENCOUNTER — Telehealth: Payer: Self-pay | Admitting: Family Medicine

## 2019-12-07 NOTE — Telephone Encounter (Signed)
Patient left message with after hours service 12/06/19 8:51 pm: Stated he just had a BM with quite a bit of blood in it. Stated he also had some rectal discomfort and bleeding. This started about 1 month ago and has increased over time. No abdominal pain.  Nurse advised patient to see his PCP within three days.  Patient already had appt scheduled Thursday for depression followup. I reached out to patient and moved his appt to tomorrow for both issues.

## 2019-12-08 ENCOUNTER — Encounter: Payer: Self-pay | Admitting: Family Medicine

## 2019-12-08 ENCOUNTER — Ambulatory Visit: Payer: Managed Care, Other (non HMO) | Admitting: Family Medicine

## 2019-12-08 VITALS — BP 104/70 | HR 70 | Temp 98.2°F | Ht 67.0 in | Wt 151.8 lb

## 2019-12-08 DIAGNOSIS — F321 Major depressive disorder, single episode, moderate: Secondary | ICD-10-CM | POA: Diagnosis not present

## 2019-12-08 DIAGNOSIS — K625 Hemorrhage of anus and rectum: Secondary | ICD-10-CM

## 2019-12-08 DIAGNOSIS — F419 Anxiety disorder, unspecified: Secondary | ICD-10-CM

## 2019-12-08 NOTE — Telephone Encounter (Signed)
Noted. Confirmed appt is 12/08/19 @ 1000. Patient has arrived.

## 2019-12-08 NOTE — Progress Notes (Signed)
Sergio Meyers is a 24 y.o. male  Chief Complaint  Patient presents with  . Follow-up    Depression  . Rectal Bleeding   HPI: Sergio Meyers is a 24 y.o. male here for f/u on depression. He was started on prozac 28m daily (575mdaily x 1 week then increase to 1040mat last appt on 11/17/19.  Today pt states he is feeling less tired during the day, less of a desire to be isolated. No side effects. Sleep - using melatonin with good result.  [facility] 11/17/2019 09/10/2019  Decreased Interest '2 2 1  ' Down, Depressed, Hopeless 1 1 0  PHQ - 2 Score '3 3 1  ' Altered sleeping 3 2 -  Tired, decreased energy 2 3 -  Change in appetite 1 3 -  Feeling bad or failure about yourself  0 1 -  Trouble concentrating 1 3 -  Moving slowly or fidgety/restless 0 1 -  Suicidal thoughts 0 0 -  PHQ-9 Score 10 16 -  Difficult doing work/chores Somewhat difficult Somewhat difficult -   GAD 7 : Generalized Anxiety Score 12/08/2019 11/17/2019  Nervous, Anxious, on Edge 0 1  Control/stop worrying 0 1  Worry too much - different things 0 0  Trouble relaxing 0 0  Restless 1 2  Easily annoyed or irritable 0 2  Afraid - awful might happen 0 0  Total GAD 7 Score 1 6  Anxiety Difficulty Not difficult at all Somewhat difficult    Pt also complains of rectal bleeding with BM. This has been occurring with every BM x 1.5 wks. Pt states this past week bright red blood has filled toilet bowl. No bleeding independent of BM. No fever, chills. No n/v. Pt has BM once per day. No abd pain. No rectal pain with BM. Appetite comes/goes - not new for pt. No h/o hemorrhoids. No weight loss. No night sweats.  This has happened in the past but was always small amount and on toilet paper.  No fam h/o IBD, CRC.  Wt Readings from Last 3 Encounters:  12/08/19 151 lb 12.8 oz (68.9 kg)  11/17/19 148 lb (67.1 kg)  01/29/19 151 lb (68.5 kg)    History reviewed. No pertinent past  medical history.  History reviewed. No pertinent surgical history.  Social History   Socioeconomic History  . Marital status: Single    Spouse name: Not on file  . Number of children: 0  . Years of education: Not on file  . Highest education level: Bachelor's degree (e.g., BA, AB, BS)  Occupational History  . Not on file  Tobacco Use  . Smoking status: Never Smoker  . Smokeless tobacco: Never Used  Vaping Use  . Vaping Use: Never used  Substance and Sexual Activity  . Alcohol use: Never  . Drug use: Never  . Sexual activity: Not on file  Other Topics Concern  . Not on file  Social History Narrative  . Not on file   Social Determinants of Health   Financial Resource Strain:   . Difficulty of Paying Living Expenses:   Food Insecurity:   . Worried About RunCharity fundraiser the Last Year:   . RanArboriculturist the Last Year:   Transportation Needs:   . LacFilm/video editoredical):   . LMarland Kitchenck of Transportation (Non-Medical):   Physical Activity:   . Days of Exercise per Week:   . Minutes of  Exercise per Session:   Stress:   . Feeling of Stress :   Social Connections:   . Frequency of Communication with Friends and Family:   . Frequency of Social Gatherings with Friends and Family:   . Attends Religious Services:   . Active Member of Clubs or Organizations:   . Attends Archivist Meetings:   Marland Kitchen Marital Status:   Intimate Partner Violence:   . Fear of Current or Ex-Partner:   . Emotionally Abused:   Marland Kitchen Physically Abused:   . Sexually Abused:     Family History  Problem Relation Age of Onset  . Diabetes Father   . Hypertension Father   . Diabetes Other   . Hypertension Other   . CAD Other      Immunization History  Administered Date(s) Administered  . DTaP 05/14/1995, 07/19/1995, 09/10/1995, 09/17/1996, 04/02/2000  . Hepatitis A 12/16/2006  . Hepatitis B 1996-04[birth year]03, 05/14/1995, 06/04/1996  . HiB (PRP-OMP) 05/14/1995, 09/10/1995,  06/04/1996, 07/18/1996  . IPV 05/14/1995, 07/19/1995, 09/17/1996, 04/02/2000  . Influenza Inj Mdck Quad Pf 04/05/2018  . Influenza,inj,Quad PF,6+ Mos 01/29/2019  . Influenza-Unspecified 02/28/2006  . MMR 03/12/1996, 03/13/1999  . Tdap 06/27/2006  . Varicella 03/12/1996    Outpatient Encounter Medications as of 12/08/2019  Medication Sig  . FLUoxetine (PROZAC) 10 MG tablet Take 1/2 tab daily x 1 week then increase to 1 tab daily (Patient taking differently: Take 10 mg by mouth daily. )  . Vitamin D, Ergocalciferol, (DRISDOL) 1.25 MG (50000 UNIT) CAPS capsule Take 1 capsule (50,000 Units total) by mouth every 7 (seven) days.   No facility-administered encounter medications on file as of 12/08/2019.     ROS: Pertinent positives and negatives noted in HPI. Remainder of ROS non-contributory    No Known Allergies  BP 104/70 (BP Location: Left Arm, Patient Position: Sitting, Cuff Size: Normal)   Pulse 70   Temp 98.2 F (36.8 C) (Temporal)   Ht '5\' 7"'  (1.702 m)   Wt 151 lb 12.8 oz (68.9 kg)   SpO2 100%   BMI 23.78 kg/m   Physical Exam Constitutional:      Appearance: Normal appearance. He is not ill-appearing, toxic-appearing or diaphoretic.  Abdominal:     General: Abdomen is flat. Bowel sounds are normal. There is no distension.     Palpations: Abdomen is soft.     Tenderness: There is no abdominal tenderness.  Skin:    Coloration: Skin is not pale.  Neurological:     General: No focal deficit present.     Mental Status: He is alert and oriented to person, place, and time.  Psychiatric:        Mood and Affect: Mood normal.        Behavior: Behavior normal.      A/P:  1. Bright red rectal bleeding - symptoms x 1.5 wks, BRB filling toilet bowl. Has happened in the past but very small amount on toilet paper and very sporadic - no fever, chills, abd pain, other GI symptoms - Ambulatory referral to Gastroenterology - CBC - C-reactive protein - Sedimentation rate -  Iron, TIBC and Ferritin Panel  2. Depression, major, single episode, moderate ([place]) 3. Anxiety - improved GAD, PHQ-9 - symptoms improving, no side effects - cont prozac 43m daily - f/u PRN  This visit occurred during the SARS-CoV-2 public health emergency.  Safety protocols were in place, including screening questions prior to the visit, additional usage of staff PPE, and extensive cleaning of exam  room while observing appropriate contact time as indicated for disinfecting solutions.'

## 2019-12-09 ENCOUNTER — Ambulatory Visit: Payer: Managed Care, Other (non HMO) | Admitting: Family Medicine

## 2019-12-09 ENCOUNTER — Encounter: Payer: Self-pay | Admitting: Family Medicine

## 2019-12-09 LAB — IRON,TIBC AND FERRITIN PANEL
Ferritin: 60 ng/mL (ref 30–400)
Iron Saturation: 24 % (ref 15–55)
Iron: 70 ug/dL (ref 38–169)
Total Iron Binding Capacity: 291 ug/dL (ref 250–450)
UIBC: 221 ug/dL (ref 111–343)

## 2019-12-09 LAB — CBC
Hematocrit: 44.9 % (ref 37.5–51.0)
Hemoglobin: 15.6 g/dL (ref 13.0–17.7)
MCH: 32.4 pg (ref 26.6–33.0)
MCHC: 34.7 g/dL (ref 31.5–35.7)
MCV: 93 fL (ref 79–97)
Platelets: 281 10*3/uL (ref 150–450)
RBC: 4.82 x10E6/uL (ref 4.14–5.80)
RDW: 12.9 % (ref 11.6–15.4)
WBC: 4.8 10*3/uL (ref 3.4–10.8)

## 2019-12-09 LAB — C-REACTIVE PROTEIN: CRP: <1 mg/L (ref 0–10)

## 2019-12-09 LAB — SEDIMENTATION RATE: Sed Rate: 21 mm/hr — ABNORMAL HIGH (ref 0–15)

## 2019-12-22 ENCOUNTER — Encounter: Payer: Self-pay | Admitting: Gastroenterology

## 2019-12-28 ENCOUNTER — Encounter: Payer: Self-pay | Admitting: Gastroenterology

## 2019-12-28 ENCOUNTER — Ambulatory Visit: Payer: Managed Care, Other (non HMO) | Admitting: Gastroenterology

## 2019-12-28 VITALS — BP 114/72 | HR 79 | Ht 68.0 in | Wt 153.2 lb

## 2019-12-28 DIAGNOSIS — K921 Melena: Secondary | ICD-10-CM | POA: Diagnosis not present

## 2019-12-28 DIAGNOSIS — K648 Other hemorrhoids: Secondary | ICD-10-CM | POA: Diagnosis not present

## 2019-12-28 MED ORDER — HYDROCORTISONE ACETATE 25 MG RE SUPP: 25.0000 mg | Freq: Two times a day (BID) | RECTAL | 1 refills | Status: DC

## 2019-12-28 NOTE — Progress Notes (Signed)
Chief Complaint: Hematochezia, dyschezia  Referring Provider:     Ronnald Nian, DO  HPI:    Sergio Meyers is a 24 y.o. male with a history of depression, recently started on Prozac, referred to the [facility]astroenterology Clinic for evaluation of hematochezia.  He reports recent onset BRBPR starting earlier this month.  Has had similar episodes in the past, but described as scant BRB on tissue paper.  Now BRB filling the toilet water and occurring more regularly. Did have dyschezia during sxs onset which has since resolved.  Otherwise no change in bowel habits to include diarrhea, constipation.  No associated abdominal pain, fever, chills, nausea, vomiting.  Was seen by his PCM, Dr. Letta Median, for this issue on 12/08/2019.  Evaluation largely unrevealing to include normal CBC, iron panel, CRP; ESR mildly elevated at 21 (ULN 15).  No previous EGD or colonoscopy.  No known family history of CRC, GI malignancy, liver disease, pancreatic disease, or IBD.    Past Medical History:  Diagnosis Date  . Depression      History reviewed. No pertinent surgical history. Family History  Problem Relation Age of Onset  . Diabetes Father   . Hypertension Father   . Diabetes Other   . Hypertension Other   . CAD Other   . Colon cancer Neg Hx   . Esophageal cancer Neg Hx    Social History   Tobacco Use  . Smoking status: Never Smoker  . Smokeless tobacco: Never Used  Vaping Use  . Vaping Use: Never used  Substance Use Topics  . Alcohol use: Yes    Comment: rarel  . Drug use: Never   Current Outpatient Medications  Medication Sig Dispense Refill  . FLUoxetine (PROZAC) 10 MG tablet Take 1/2 tab daily x 1 week then increase to 1 tab daily (Patient taking differently: Take 10 mg by mouth daily. ) 30 tablet 2  . Vitamin D, Cholecalciferol, 50 MCG (2000 UT) CAPS Take 1 capsule by mouth daily.     No current facility-administered medications for this visit.     No Known Allergies   Review of Systems: All systems reviewed and negative except where noted in HPI.     Physical Exam:    Wt Readings from Last 3 Encounters:  12/28/19 153 lb 4 oz (69.5 kg)  12/08/19 151 lb 12.8 oz (68.9 kg)  11/17/19 148 lb (67.1 kg)    BP 114/72   Pulse 79   Ht _0  (1.727 m)   Wt 153 lb 4 oz (69.5 kg)   BMI 23.30 kg/m  Constitutional:  Pleasant, in no acute distress. Psychiatric: Normal mood and affect. Behavior is normal. EENT: Pupils normal.  Conjunctivae are normal. No scleral icterus. Neck supple. No cervical LAD. Cardiovascular: Normal rate, regular rhythm. No edema Pulmonary/chest: Effort normal and breath sounds normal. No wheezing, rales or rhonchi. Abdominal: Soft, nondistended, nontender. Bowel sounds active throughout. There are no masses palpable. No hepatomegaly. Neurological: Alert and oriented to person place and time. Skin: Skin is warm and dry. No rashes noted. Rectal exam: Sensation intact and preserved anal wink. No external anal fissures, external hemorrhoids or skin tags. Normal sphincter tone. No palpable mass. No blood on the exam glove.  Anoscopy with 2 columns of grade 1 internal hemorrhoids.  (Chaperone: Thurmon Fair, CMA).    ASSESSMENT AND PLAN;   1) Internal hemorrhoids 2) Hematochezia  -Trial course of  Anusol suppository.  Counseled not to use beyond 10 days -Increase dietary fiber.  Provided with fiber chart today and detailed instruction -Add fiber supplement as needed for goal of soft, regular stools without straining to have BM -Continue adequate hydration of at least 64 ounces of water/day -Discussed colonoscopy to evaluate for more proximal etiology and to further define size/grade of internal hemorrhoids.  He elected for medical and conservative management as above -If ongoing symptoms, can plan for colonoscopy along with consideration of hemorrhoid banding if needed    Lavena Bullion, DO, FACG   12/28/2019, 2:25 PM   Zacheriah Stumpe, Garvin Fila, DO

## 2019-12-28 NOTE — Patient Instructions (Addendum)
If you are age 25 or older, your body mass index should be between 23-30. Your Body mass index is 23.3 kg/m. If this is out of the aforementioned range listed, please consider follow up with your Primary Care Provider.  If you are age 29 or younger, your body mass index should be between 19-25. Your Body mass index is 23.3 kg/m. If this is out of the aformentioned range listed, please consider follow up with your Primary Care Provider.   We have sent the following medications to your pharmacy for you to pick up at your convenience: Anusol Suppositories  Start Fiber (Benefiber, Citrucel) this is over-the-counter.  See Attached Fiber chart.  Follow up as needed.  It was a pleasure to see you today!  Doristine Locks, D.O. Fiber Chart  You should be consuming 25-30g of fiber per day and drinking 8 glasses of water to help your bowels move regularly.  In the chart below you can look up how much fiber you are getting in an average day.  If you are not getting enough fiber, you should add a fiber supplement to your diet.  Examples of this include Metamucil, FiberCon and Citrucel.  These can be purchased at your local grocery store or pharmacy.      [email or web address]

## 2020-01-19 ENCOUNTER — Encounter: Payer: Self-pay | Admitting: Family Medicine

## 2020-01-19 DIAGNOSIS — F321 Major depressive disorder, single episode, moderate: Secondary | ICD-10-CM

## 2020-01-20 MED ORDER — FLUOXETINE HCL 10 MG PO TABS: 10.0000 mg | ORAL_TABLET | Freq: Every day | ORAL | 3 refills | Status: DC

## 2020-02-10 ENCOUNTER — Ambulatory Visit: Payer: Managed Care, Other (non HMO) | Admitting: Gastroenterology

## 2020-03-14 ENCOUNTER — Encounter: Payer: Self-pay | Admitting: Family Medicine

## 2020-03-14 DIAGNOSIS — F321 Major depressive disorder, single episode, moderate: Secondary | ICD-10-CM

## 2020-03-15 MED ORDER — FLUOXETINE HCL 20 MG PO TABS: 20.0000 mg | ORAL_TABLET | Freq: Every day | ORAL | 3 refills | Status: DC

## 2020-03-15 NOTE — Telephone Encounter (Signed)
Please see message and advise.  Thank you. ° °

## 2020-05-05 ENCOUNTER — Other Ambulatory Visit: Payer: Self-pay

## 2020-05-05 ENCOUNTER — Other Ambulatory Visit: Payer: Managed Care, Other (non HMO)

## 2020-05-05 DIAGNOSIS — Z20822 Contact with and (suspected) exposure to covid-19: Secondary | ICD-10-CM

## 2020-05-09 LAB — NOVEL CORONAVIRUS, NAA: SARS-CoV-2, NAA: DETECTED — AB

## 2020-08-07 ENCOUNTER — Encounter: Payer: Self-pay | Admitting: Family Medicine

## 2021-01-08 ENCOUNTER — Other Ambulatory Visit: Payer: Self-pay

## 2021-01-09 ENCOUNTER — Ambulatory Visit (INDEPENDENT_AMBULATORY_CARE_PROVIDER_SITE_OTHER): Payer: BC Managed Care – PPO | Admitting: Family Medicine

## 2021-01-09 ENCOUNTER — Encounter: Payer: Self-pay | Admitting: Family Medicine

## 2021-01-09 VITALS — BP 120/88 | HR 85 | Temp 98.3°F | Ht 68.0 in | Wt 173.2 lb

## 2021-01-09 DIAGNOSIS — Z8639 Personal history of other endocrine, nutritional and metabolic disease: Secondary | ICD-10-CM | POA: Diagnosis not present

## 2021-01-09 DIAGNOSIS — F339 Major depressive disorder, recurrent, unspecified: Secondary | ICD-10-CM | POA: Diagnosis not present

## 2021-01-09 DIAGNOSIS — Z23 Encounter for immunization: Secondary | ICD-10-CM | POA: Diagnosis not present

## 2021-01-09 LAB — T4, FREE: Free T4: 0.86 ng/dL (ref 0.60–1.60)

## 2021-01-09 LAB — TSH: TSH: 2.19 u[IU]/mL (ref 0.35–5.50)

## 2021-01-09 NOTE — Progress Notes (Signed)
[facility]IMARY [facility]ARE [facility] [address] [place] [address] Dept: [phone] Dept Fax: [phone]  Transfer of Care Office Visit  Subjective:    Patient ID: Sergio Meyers, male    DOB: [birth year], 25 y.o..   MRN: [id number]  Chief Complaint  Patient presents with   Transitions Of Care    Est care.     History of Present Illness:  Patient is in today to establish care. Sergio Meyers was born in [place], [place]. He moved to [place] with is family when he was in the 3rd grade. He attended college at [employer], majoring in music education. 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 He is now working for [employer] as a Merchandiser, retail. He is single and has no children. He denies tobacco or drug use and only occasionally drinks alcohol.  Sergio Meyers has a history of depression. He notes that initially this presented as a lack of energy and social isolation. He has been on Prozac for some time now and finds this has helped quite a bit. He is not engaged in regular exercise at this point, but realizes he felt better when he was doing this.  Sergio Meyers has a history of prior Hashimoto's thyroiditis. He has seen Dr. Lonzo Cloud (endocrinology) in the past related to this. She has released him back to primary care for ongoing monitoring. His labs last year showed a normal TSH, T4 and absent TPO antibodies. However, he does note some signs of weight gain, low energy, dry skin, and cold intolerance.  Past Medical History: Patient Active Problem List   Diagnosis Date Noted   Depression, recurrent (HCC) 01/09/2021   History of Hashimoto thyroiditis 01/09/2021   History reviewed. No pertinent surgical history.  Family History  Problem Relation Age of Onset   Diabetes Father    Hypertension Father    Diabetes Paternal Aunt    Diabetes Paternal Grandfather    Hypertension Paternal Grandfather    CAD Paternal Grandfather     Colon cancer Neg Hx    Esophageal cancer Neg Hx    Outpatient Medications Prior to Visit  Medication Sig Dispense Refill   FLUoxetine (PROZAC) 20 MG tablet Take 1 tablet (20 mg total) by mouth daily. 90 tablet 3   Vitamin D, Cholecalciferol, 50 MCG (2000 UT) CAPS Take 1 capsule by mouth daily.     hydrocortisone (ANUSOL-HC) 25 MG suppository Place 1 suppository (25 mg total) rectally 2 (two) times daily. 12 suppository 1   No facility-administered medications prior to visit.   Allergies  Allergen Reactions   Amoxicillin     Unsure reaction-happened at childbirth    Objective:   Today's Vitals   01/09/21 1408  BP: 120/88  Pulse: 85  Temp: 98.3 F (36.8 C)  TempSrc: Temporal  SpO2: 99%  Weight: 173 lb 3.2 oz (78.6 kg)  Height: 5\' 8"  (1.727 m)   Body mass index is 26.33 kg/m.   General: Well developed, well nourished. No acute distress. Psych: Alert and oriented. Normal mood and affect.  Health Maintenance Due  Topic Date Due   COVID-19 Vaccine (1) Never done   HPV VACCINES (1 - Male 2-dose series) Never done   HIV Screening  Never done   Hepatitis C Screening  Never done   TETANUS/TDAP  06/26/2016   INFLUENZA VACCINE  11/27/2020      Assessment & Plan:   1. Depression, recurrent [facility]) Sergio Meyers is stable on Prozac.  I recommended he start exercising regularly. At this point, he is not interested in counseling.  2. History of Hashimoto thyroiditis Due for annual assessment of thyroid labs. He has some current symptoms suggestive of hypothyroidism, so we will watch for that on the lab results.  - T4, free - TSH - Thyroid Peroxidase Antibodies (TPO) (REFL)  Loyola Mast, MD

## 2021-01-10 LAB — THYROID PEROXIDASE ANTIBODIES (TPO) (REFL): Thyroperoxidase Ab SerPl-aCnc: 5 IU/mL (ref <9)

## 2021-02-18 IMAGING — US US THYROID
1 series · 13 of 25 positions shown · non-contrast
Comparison: None.

CLINICAL DATA: Hyperthyroid.

EXAM:
THYROID ULTRASOUND
TECHNIQUE: Ultrasound examination of the thyroid gland and adjacent soft
tissues was performed.

[Series 1: us thyroid · 13 of 66 slices shown]
[im 1/66]
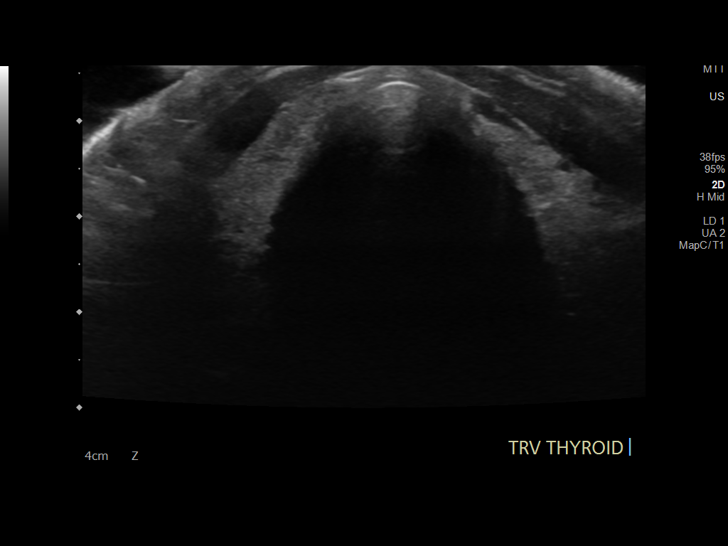
[im 6/66]
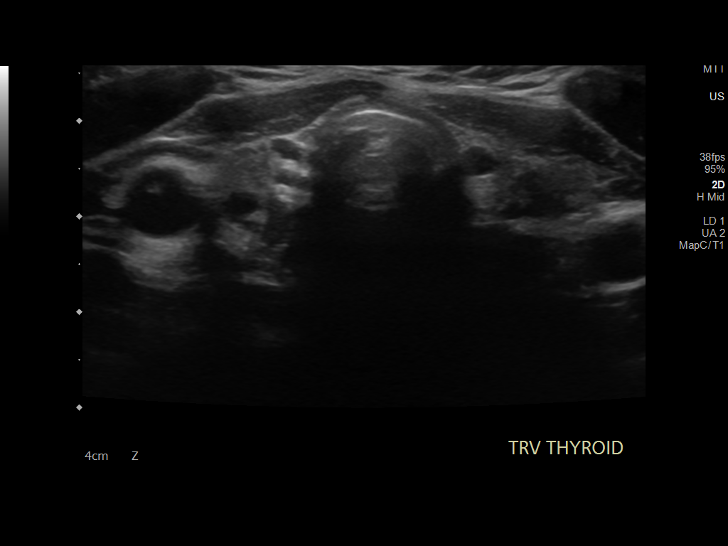
[im 11/66]
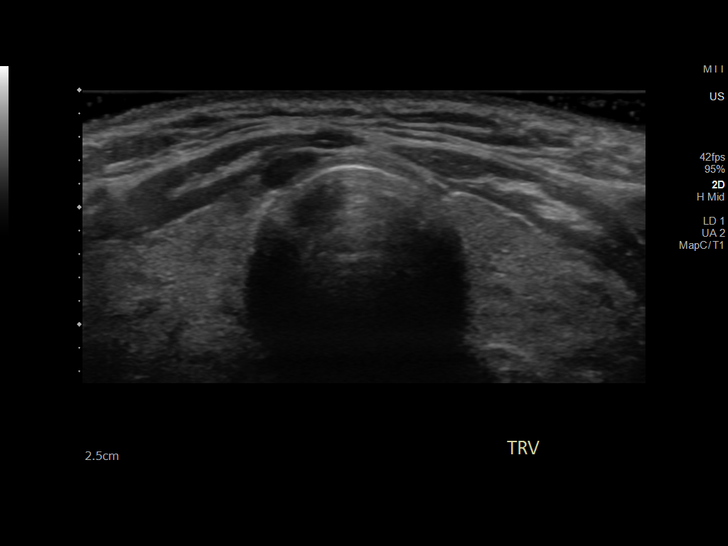
[im 17/66]
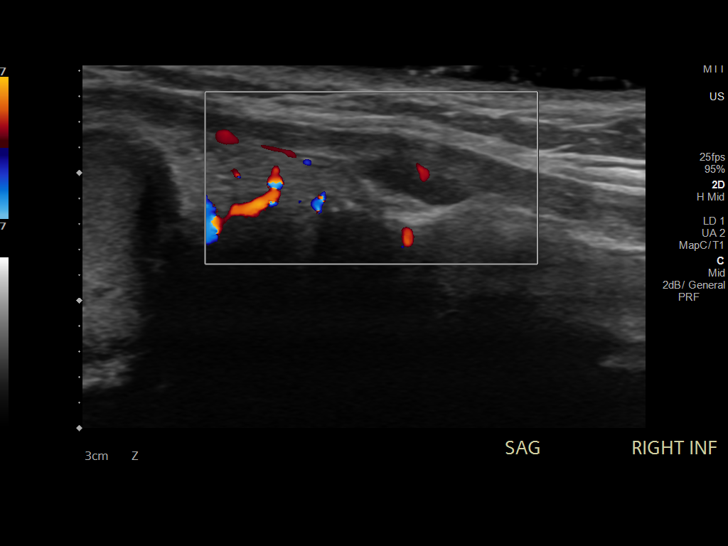
[im 22/66]
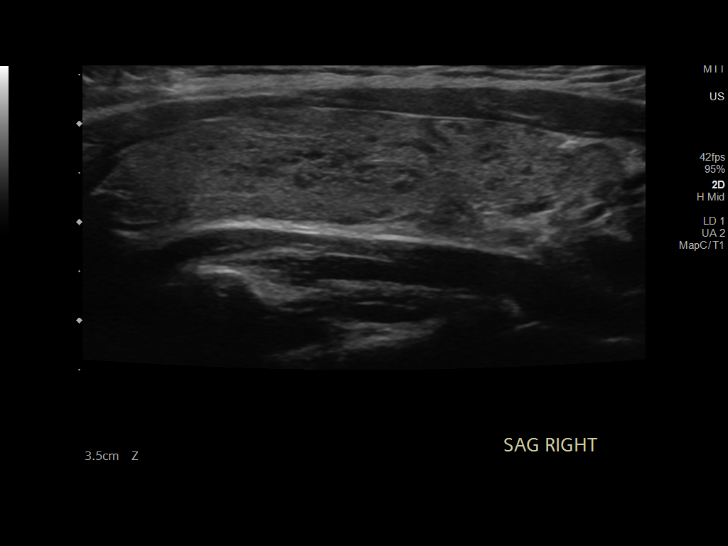
[im 28/66]
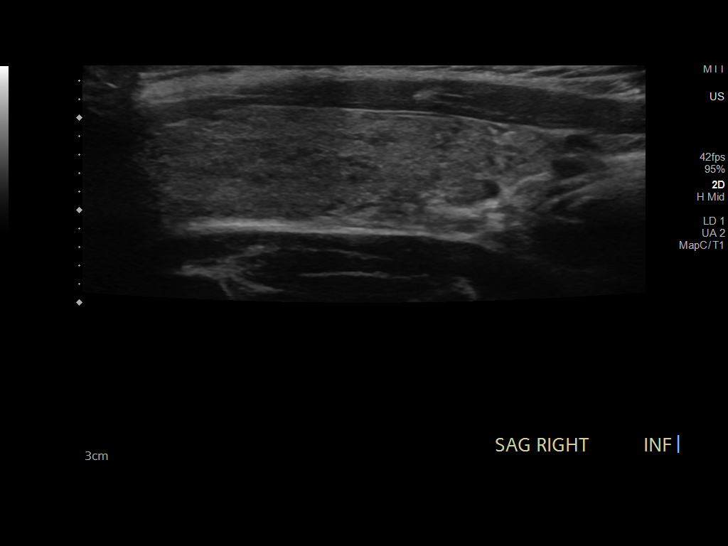
[im 33/66]
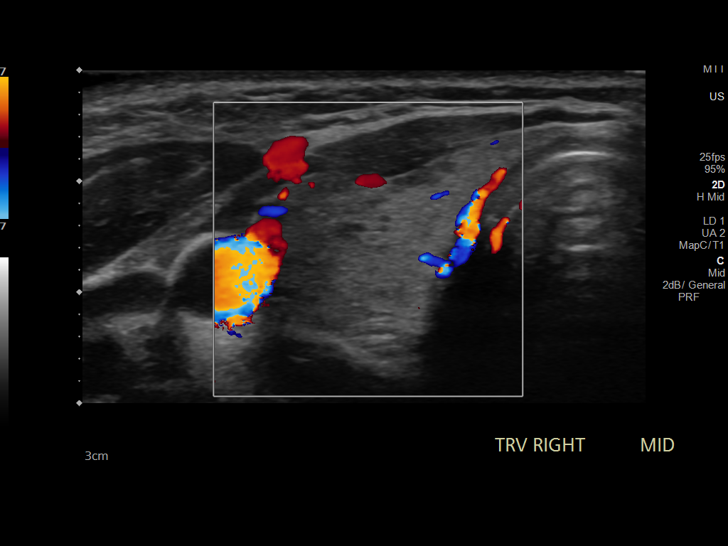
[im 38/66]
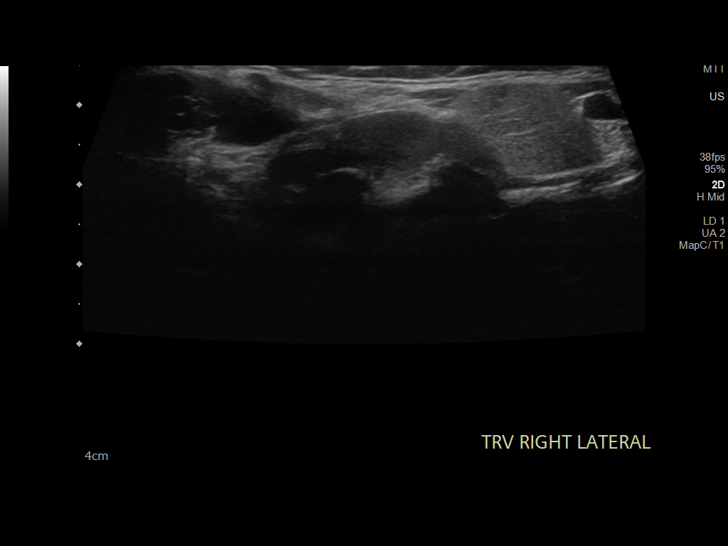
[im 44/66]
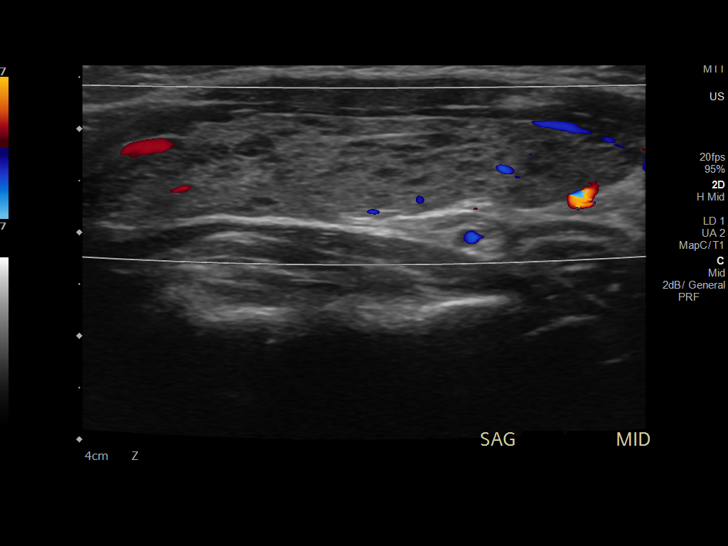
[im 49/66]
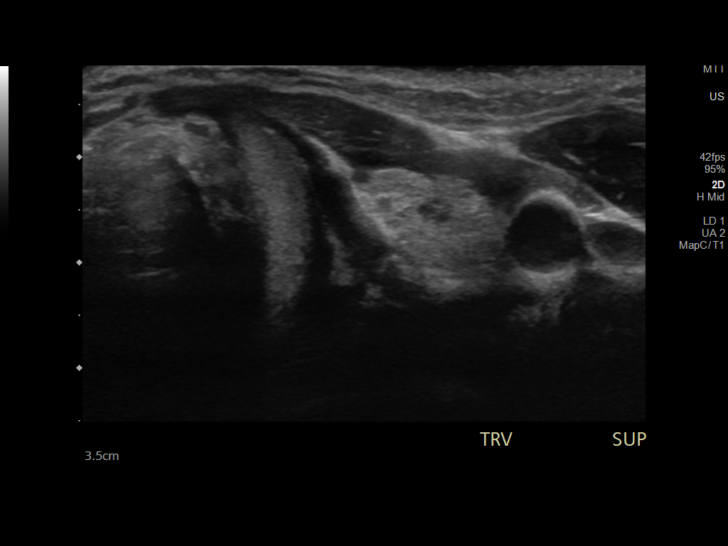
[im 55/66]
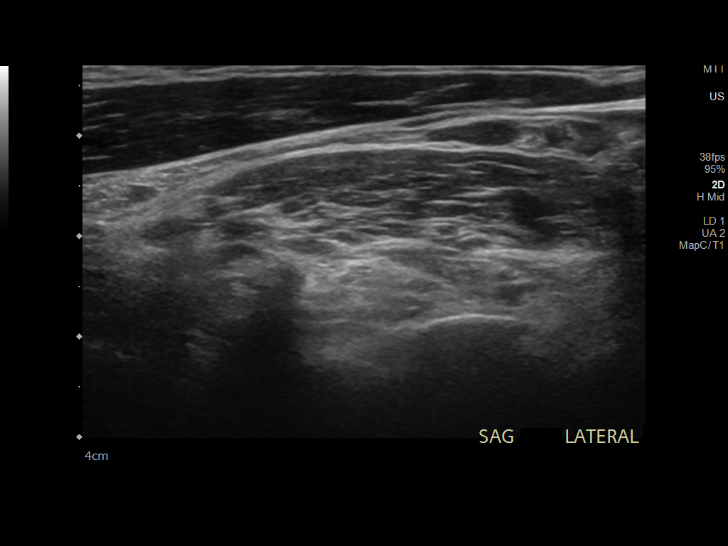
[im 60/66]
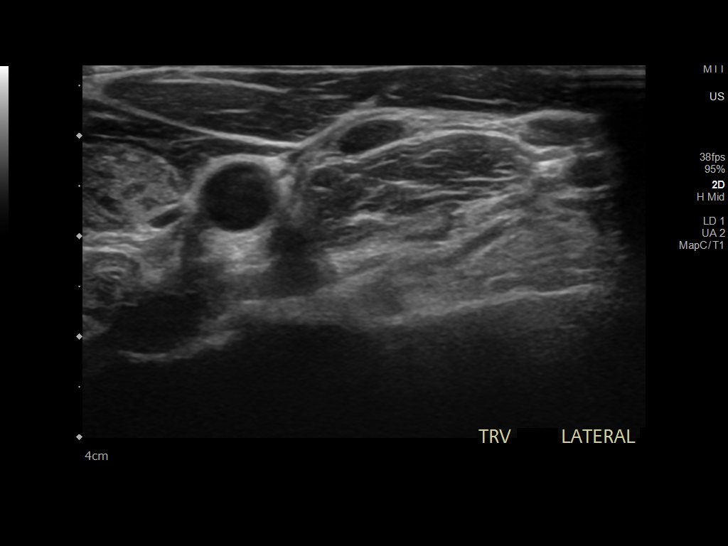
[im 66/66]
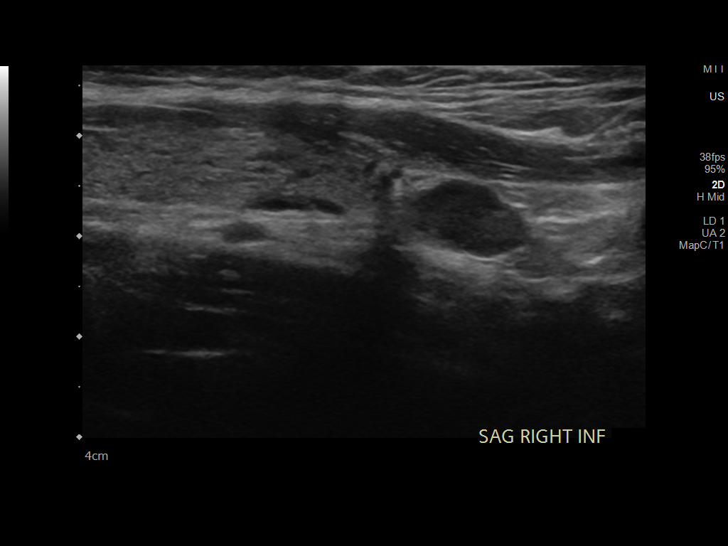

[13 of 25 positions shown; findings below may reference images not displayed]

FINDINGS: Parenchymal Echotexture: Moderately heterogenous

Isthmus: 0.1 cm

Right lobe: 5.3 x 1.6 x 2.2 cm

Left lobe: 5.3 x 1.2 x 2.0 cm

_________________________________________________________

Estimated total number of nodules >/= 1 cm: 0

Number of spongiform nodules >/=  2 cm not described below (TR1): 0

Number of mixed cystic and solid nodules >/= 1.5 cm not described
below (TR2): 0

_________________________________________________________

There are no concerning thyroid nodules with the thyroid gland
demonstrating heterogeneous parenchyma suggestive of underlying
thyroiditis. Discrete small hypoechoic area measuring approximately
0.9 x 0.2 x 0.5 cm lies along the anterior aspect of the right
isthmus and may lie outside of the thyroid gland. This is not of
concern and may represent a small lymph node or if it is in the
thyroid gland does not meet criteria for biopsy or further
follow-up.
IMPRESSION: Mildly enlarged and heterogeneous thyroid gland suggestive of
underlying thyroiditis. Small hypoechoic area along the anterior
aspect of the right isthmus is not concerning, as above.

The above is in keeping with the ACR TI-RADS recommendations - [HOSPITAL] 5114;[DATE].

## 2021-04-05 ENCOUNTER — Other Ambulatory Visit: Payer: Self-pay

## 2021-04-05 DIAGNOSIS — F321 Major depressive disorder, single episode, moderate: Secondary | ICD-10-CM

## 2021-04-05 NOTE — Telephone Encounter (Signed)
Refill request for: Fluoxetine HCL 20 mg  LR 03/15/20, #90, 3 rf (Dr Barron Alvine) LOV 01/09/21  (Dr Veto Kemps) FOV none scheduled.    Please review and advise.   Thanks. Dm/cma

## 2021-04-06 MED ORDER — FLUOXETINE HCL 20 MG PO TABS: 20.0000 mg | ORAL_TABLET | Freq: Every day | ORAL | 3 refills | Status: DC

## 2021-06-16 ENCOUNTER — Encounter: Payer: Self-pay | Admitting: Family Medicine

## 2021-07-02 ENCOUNTER — Encounter: Payer: Self-pay | Admitting: Family Medicine

## 2021-07-02 ENCOUNTER — Other Ambulatory Visit: Payer: Self-pay

## 2021-07-02 ENCOUNTER — Ambulatory Visit (INDEPENDENT_AMBULATORY_CARE_PROVIDER_SITE_OTHER): Payer: BC Managed Care – PPO | Admitting: Family Medicine

## 2021-07-02 VITALS — BP 126/70 | HR 112 | Temp 98.0°F | Ht 68.0 in | Wt 173.2 lb

## 2021-07-02 DIAGNOSIS — Z8639 Personal history of other endocrine, nutritional and metabolic disease: Secondary | ICD-10-CM

## 2021-07-02 DIAGNOSIS — F339 Major depressive disorder, recurrent, unspecified: Secondary | ICD-10-CM | POA: Diagnosis not present

## 2021-07-02 DIAGNOSIS — Z114 Encounter for screening for human immunodeficiency virus [HIV]: Secondary | ICD-10-CM

## 2021-07-02 DIAGNOSIS — Z13220 Encounter for screening for lipoid disorders: Secondary | ICD-10-CM | POA: Diagnosis not present

## 2021-07-02 DIAGNOSIS — B079 Viral wart, unspecified: Secondary | ICD-10-CM

## 2021-07-02 DIAGNOSIS — R251 Tremor, unspecified: Secondary | ICD-10-CM

## 2021-07-02 DIAGNOSIS — Z0000 Encounter for general adult medical examination without abnormal findings: Secondary | ICD-10-CM | POA: Diagnosis not present

## 2021-07-02 DIAGNOSIS — Z1159 Encounter for screening for other viral diseases: Secondary | ICD-10-CM

## 2021-07-02 DIAGNOSIS — E0590 Thyrotoxicosis, unspecified without thyrotoxic crisis or storm: Secondary | ICD-10-CM

## 2021-07-02 LAB — COMPREHENSIVE METABOLIC PANEL
ALT: 39 U/L (ref 0–53)
AST: 24 U/L (ref 0–37)
Albumin: 4.1 g/dL (ref 3.5–5.2)
Alkaline Phosphatase: 48 U/L (ref 39–117)
BUN: 13 mg/dL (ref 6–23)
CO2: 27 mEq/L (ref 19–32)
Calcium: 9.6 mg/dL (ref 8.4–10.5)
Chloride: 104 mEq/L (ref 96–112)
Creatinine, Ser: 0.54 mg/dL (ref 0.40–1.50)
GFR: 137.73 mL/min (ref >60.00)
Glucose, Bld: 96 mg/dL (ref 70–99)
Potassium: 3.5 mEq/L (ref 3.5–5.1)
Sodium: 139 mEq/L (ref 135–145)
Total Bilirubin: 0.7 mg/dL (ref 0.2–1.2)
Total Protein: 7 g/dL (ref 6.0–8.3)

## 2021-07-02 LAB — T4, FREE: Free T4: 4.25 ng/dL — ABNORMAL HIGH (ref 0.60–1.60)

## 2021-07-02 LAB — CBC
HCT: 40.7 % (ref 39.0–52.0)
Hemoglobin: 13.4 g/dL (ref 13.0–17.0)
MCHC: 33 g/dL (ref 30.0–36.0)
MCV: 89.5 fl (ref 78.0–100.0)
Platelets: 313 10*3/uL (ref 150.0–400.0)
RBC: 4.54 Mil/uL (ref 4.22–5.81)
RDW: 13.5 % (ref 11.5–15.5)
WBC: 3.9 10*3/uL — ABNORMAL LOW (ref 4.0–10.5)

## 2021-07-02 LAB — LIPID PANEL
Cholesterol: 142 mg/dL (ref 0–200)
HDL: 41.9 mg/dL (ref >39.00)
LDL Cholesterol: 83 mg/dL (ref 0–99)
NonHDL: 99.95
Total CHOL/HDL Ratio: 3
Triglycerides: 85 mg/dL (ref 0.0–149.0)
VLDL: 17 mg/dL (ref 0.0–40.0)

## 2021-07-02 LAB — TSH: TSH: 0 u[IU]/mL — ABNORMAL LOW (ref 0.35–5.50)

## 2021-07-02 MED ORDER — ATENOLOL 50 MG PO TABS: 50.0000 mg | ORAL_TABLET | Freq: Every day | ORAL | 3 refills | Status: DC

## 2021-07-02 NOTE — Progress Notes (Signed)
?[facility] ?[facility] ?[address] ?[place] [address] ?Dept: [phone] ?Dept Fax: [phone] ? ?Annual Physical Visit ? ?Subjective:  ? ? Patient ID: Sergio Meyers, male    DOB: [birth year], 26 y.o..   MRN: [id number] ? ?Chief Complaint  ?Patient presents with  ? Annual Exam  ?  CPE/labs.  Fasting today.  C/o having a spot on his hand x months and having lots of shaking in both arms and legs.    ? ? ?History of Present Illness: ? ?Patient is in today for an annual physical/preventative visit. ? ?Review of Systems  ?Constitutional:  Negative for chills, fever and weight loss.  ?Cardiovascular:  Positive for palpitations. Negative for chest pain.  ?Musculoskeletal:   ?     Notes weakness in his left ankle after bowling. He finds when he stands for very long, he gets worse tremor in his legs.  ?Skin:  Positive for rash.  ?     Notes a lesion in the palm of his right hand present for some months.  ?Neurological:  Positive for tremors and weakness. Negative for tingling and sensory change.  ?     Long-standing history of tremor, since even childhood. This had reached a point several years ago where he was sent to a neurologist ([facility]). He was found to  Hashimoto's thyroiditis. He was seen by endocrinology [facility]), however, by that point the tremor had improved (though never went completely away) and thyroiditis had resolved. Now has a return of significant tremor in hands and legs over last month. Has tried stopping caffeine, but this does not make a difference.  ?Psychiatric/Behavioral:  Negative for depression. The patient is not nervous/anxious.   ? ?Past Medical History: ?Patient Active Problem List  ? Diagnosis Date Noted  ? Depression, recurrent (HCC) 01/09/2021  ? History of Hashimoto thyroiditis 01/09/2021  ? ?No past surgical history on file. ? ?Family History  ?Problem Relation Age of Onset  ? Diabetes Father   ? Hypertension Father   ?  Diabetes Paternal Aunt   ? Diabetes Paternal Grandfather   ? Hypertension Paternal Grandfather   ? CAD Paternal Grandfather   ? Colon cancer Neg Hx   ? Esophageal cancer Neg Hx   ? ?Outpatient Medications Prior to Visit  ?Medication Sig Dispense Refill  ? FLUoxetine (PROZAC) 20 MG tablet Take 1 tablet (20 mg total) by mouth daily. 90 tablet 3  ? Vitamin D, Cholecalciferol, 50 MCG (2000 UT) CAPS Take 1 capsule by mouth daily.    ? ?No facility-administered medications prior to visit.  ? ?Allergies  ?Allergen Reactions  ? Amoxicillin   ?  Unsure reaction-happened at childbirth  ?   ?Objective:  ? ?Today's Vitals  ? 07/02/21 0900  ?BP: 126/70  ?Pulse: (!) 112  ?Temp: 98 ?F (36.7 ?C)  ?TempSrc: Temporal  ?Weight: 173 lb 3.2 oz (78.6 kg)  ?Height: 5\' 8"  (1.727 m)  ? ?Body mass index is 26.33 kg/m?.  ? ?General: Well developed, well nourished. No acute distress. ?HEENT: Normocephalic, non-traumatic. PERRL, EOMI. Conjunctiva clear. Fundiscopic exam  ? shows normal disc and vasculature. External ears normal. EAC and TMs normal bilaterally.  ? Nose clear without congestion or rhinorrhea. Mucous membranes moist. Oropharynx clear.  ? Good dentition. ?Neck: Supple. No lymphadenopathy. No thyromegaly. ?Lungs: Clear to auscultation bilaterally. No wheezing, rales or rhonchi. ?CV: RRR without murmurs or rubs. Mildly tachycardic. Pulses 2+ bilaterally. ?Abdomen: Soft, non-tender. Bowel sounds positive, normal pitch and  frequency. No  ? hepatosplenomegaly. No rebound or guarding. ?Extremities: Full ROM. No joint swelling or tenderness. No edema noted. ?Skin: Warm and dry. There is a 3-4 mm, flesh-colored, warty lesion on the palm of the left  ? hand. ?Neuro: CN II-XII intact. moderate tremor of both upper and lower extremities. ?Psych: Alert and oriented. Normal mood and affect. ? ?Health Maintenance Due  ?Topic Date Due  ? HPV VACCINES (1 - Male 2-dose series) Never done  ? Hepatitis C Screening  Never done  ? ?Depression screen  [facility] 2/9 07/02/2021 01/09/2021 12/08/2019  ?Decreased Interest 0 0 2  ?Down, Depressed, Hopeless 0 0 1  ?PHQ - 2 Score 0 0 3  ?Altered sleeping 2 3 3   ?Tired, decreased energy - 3 2  ?Change in appetite 2 0 1  ?Feeling bad or failure about yourself  0 0 0  ?Trouble concentrating 0 0 1  ?Moving slowly or fidgety/restless 0 0 0  ?Suicidal thoughts 0 0 0  ?PHQ-9 Score 4 6 10   ?Difficult doing work/chores Not difficult at all Somewhat difficult Somewhat difficult  ? ?GAD 7 : Generalized Anxiety Score 07/02/2021 12/08/2019 11/17/2019  ?Nervous, Anxious, on Edge 0 0 1  ?Control/stop worrying 0 0 1  ?Worry too much - different things 0 0 0  ?Trouble relaxing 0 0 0  ?Restless 0 1 2  ?Easily annoyed or irritable 0 0 2  ?Afraid - awful might happen 0 0 0  ?Total GAD 7 Score 0 1 6  ?Anxiety Difficulty Not difficult at all Not difficult at all Somewhat difficult  ?   ?Assessment & Plan:  ? ?1. Annual physical exam ?Immunizations are up to date. Discussed recommended health screenings. ? ?2. Depression, recurrent (HCC) ?PHQ indicates depression well managed. I will continue his Prozac. ? ?3. Tremor ?Likely due to a recurrence of thyroiditis. I will check labs to assess for thyroid disease and other potential causes of tremor. I would consider adding a beta blocker to reduce his tremor. ? ?- TSH ?- T4, free ?- CBC ?- Comprehensive metabolic panel ?- Thyroid peroxidase antibody ?- Thyrotropin receptor autoabs ? ?4. History of Hashimoto thyroiditis ?As above. I anticipate he may need referral back to endocrinology for ongoing assessment. ?- TSH ?- T4, free ?- Thyroid peroxidase antibody ?- Thyrotropin receptor autoabs ? ?5. Wart of hand ?Discussed management with OTC topical wart treatments. Cryotherapy or electrocautery and curettage are other options, if this is not working. ? ?6. Encounter for hepatitis C screening test for low risk patient ? ?- HCV Ab w Reflex to Quant PCR ? ?7. Screening for HIV (human immunodeficiency virus) ? ?- HIV  Antibody (routine testing w rflx) ? ?8. Screening for lipid disorders ? ?- Lipid panel ? ?Return for After testing/imaging.  ? ?02/07/2020, MD ?

## 2021-07-02 NOTE — Addendum Note (Signed)
Addended by: Loyola Mast on: 07/02/2021 04:17 PM ? ? Modules accepted: Orders ? ?

## 2021-07-03 LAB — THYROTROPIN RECEPTOR AUTOABS: Thyrotropin Receptor Ab: <1.1 IU/L (ref 0.00–1.75)

## 2021-07-03 LAB — THYROID PEROXIDASE ANTIBODY: Thyroperoxidase Ab SerPl-aCnc: 73 IU/mL — ABNORMAL HIGH (ref <9)

## 2021-07-03 LAB — HCV AB W REFLEX TO QUANT PCR: HCV Ab: NONREACTIVE

## 2021-07-03 LAB — HIV ANTIBODY (ROUTINE TESTING W REFLEX): HIV 1&2 Ab, 4th Generation: NONREACTIVE

## 2021-07-03 LAB — HCV INTERPRETATION

## 2021-07-16 ENCOUNTER — Encounter: Payer: BC Managed Care – PPO | Admitting: Family Medicine

## 2021-07-18 ENCOUNTER — Encounter: Payer: Self-pay | Admitting: Family Medicine

## 2021-07-19 ENCOUNTER — Telehealth: Payer: Self-pay

## 2021-07-19 NOTE — Telephone Encounter (Signed)
Information given to patient through My chart.  Dm/cma ? ?

## 2021-07-19 NOTE — Telephone Encounter (Signed)
Can you please and thank you check on his Endocrinology referral. Dr Veto Kemps feels that he needs to be sooner than later due to Hyperthyroid issues.   ?Thanks.  Dm/cma ? ?

## 2021-09-03 ENCOUNTER — Encounter: Payer: Self-pay | Admitting: Family Medicine

## 2021-09-04 ENCOUNTER — Encounter: Payer: Self-pay | Admitting: Family Medicine

## 2021-09-04 ENCOUNTER — Ambulatory Visit (INDEPENDENT_AMBULATORY_CARE_PROVIDER_SITE_OTHER): Payer: BC Managed Care – PPO | Admitting: Family Medicine

## 2021-09-04 ENCOUNTER — Telehealth: Payer: Self-pay

## 2021-09-04 VITALS — BP 114/68 | HR 59 | Temp 97.0°F | Ht 68.0 in | Wt 180.0 lb

## 2021-09-04 DIAGNOSIS — E0590 Thyrotoxicosis, unspecified without thyrotoxic crisis or storm: Secondary | ICD-10-CM | POA: Diagnosis not present

## 2021-09-04 DIAGNOSIS — K648 Other hemorrhoids: Secondary | ICD-10-CM | POA: Diagnosis not present

## 2021-09-04 MED ORDER — HYDROCORTISONE ACETATE 25 MG RE SUPP: 25.0000 mg | Freq: Two times a day (BID) | RECTAL | 0 refills | Status: DC

## 2021-09-04 NOTE — Telephone Encounter (Signed)
Patient was here for OV and hasn't heard anything from his Endocrinology appointment.  Can you please and thank you get him an appointment with another provider due to message in referral form doctor.   ?Thank you.  Dm/cma ? ?

## 2021-09-04 NOTE — Progress Notes (Signed)
?[facility] [place] [facility] ?[facility] ?[address] ?[place] [address] ?Dept: [phone] ?Dept Fax: [phone] ? ?Office Visit ? ?Subjective:  ? ? Patient ID: Sergio Meyers, male    DOB: 13-Jul-1995, 26 y.o..   MRN: [id number] ? ?Chief Complaint  ?Patient presents with  ? Acute Visit  ?  C/o some bleeding in stool x 2 weeks.     ? ? ?History of Present Illness: ? ?Patient is in today for evaluation of bright red blood per rectum over the past 2 weeks. [name] has had some rectal discomfort associated with this as well. He had a similar issue in 2021. At that time, he was referred to GI who found him to have internal hemorrhoids. [name] chose conservative management at that time with Anusol suppositories. He has not tried any other therapies currently. ? ?At his last visit, [name] was having significant tremors and tachycardia. He had a past history of Hashimoto's thyroiditis, which had apparently resolved. He had been seen by Dr. Lonzo Meyers in 2020 who felt this may have been a transient issue related to a viral illness. She recommended he follow for annual TFTs with his PCP and only follow-up with her as needed. I had referred him back to her in light of the new symptoms. He is now on atenolol 50 mg daily. ? ?Past Medical History: ?Patient Active Problem List  ? Diagnosis Date Noted  ? Depression, recurrent (HCC) 01/09/2021  ? History of Hashimoto thyroiditis 01/09/2021  ? ?No past surgical history on file. ? ?Family History  ?Problem Relation Age of Onset  ? Diabetes Father   ? Hypertension Father   ? Diabetes Paternal Aunt   ? Diabetes Paternal Grandfather   ? Hypertension Paternal Grandfather   ? CAD Paternal Grandfather   ? Colon cancer Neg Hx   ? Esophageal cancer Neg Hx   ? ?Outpatient Medications Prior to Visit  ?Medication Sig Dispense Refill  ? atenolol (TENORMIN) 50 MG tablet Take 1 tablet (50 mg total) by mouth daily. 90 tablet 3  ? FLUoxetine  (PROZAC) 20 MG tablet Take 1 tablet (20 mg total) by mouth daily. 90 tablet 3  ? Vitamin D, Cholecalciferol, 50 MCG (2000 UT) CAPS Take 1 capsule by mouth daily.    ? ?No facility-administered medications prior to visit.  ? ?Allergies  ?Allergen Reactions  ? Amoxicillin   ?  Unsure reaction-happened at childbirth  ? ?   ?Objective:  ? ?Today's Vitals  ? 09/04/21 0803  ?BP: 114/68  ?Pulse: (!) 59  ?Temp: (!) 97 ?F (36.1 ?C)  ?TempSrc: Temporal  ?SpO2: 97%  ?Weight: 180 lb (81.6 kg)  ?Height: 5\' 8"  (1.727 m)  ? ?Body mass index is 27.37 kg/m?.  ? ?General: Well developed, well nourished. No acute distress. ?Psych: Alert and oriented. Normal mood and affect. ? ?Health Maintenance Due  ?Topic Date Due  ? HPV VACCINES (1 - Male 2-dose series) Never done  ? ?Lab Results ?   ?Component Ref Range & Units 2 mo ago  ?Thyrotropin Receptor Ab 0.00 - 1.75 IU/L <1.10   ? ?Component Ref Range & Units 2 mo ago ?(07/02/21) 7 mo ago ?(01/09/21) 1 yr ago ?(11/17/19) 2 yr ago ?(12/02/18) 2 yr ago ?(10/09/18)  ?Thyroperoxidase Ab SerPl-aCnc <9 IU/mL 73 High   5  <8 R  56 High   35 High  R   ? ?Component Ref Range & Units 2 mo ago ?(07/02/21) 7 mo ago ?(  01/09/21) 1 yr ago ?(11/17/19) 1 yr ago ?(09/13/19) 2 yr ago ?(12/02/18) 2 yr ago ?(10/09/18)  ?Free T4 0.60 - 1.60 ng/dL 1.19 High   4.17 CM  4.08 R  1.36 R  0.82 CM  2.67 High  R  ? ?Component Ref Range & Units 2 mo ago ?(07/02/21) 7 mo ago ?(01/09/21) 1 yr ago ?(11/17/19) 1 yr ago ?(09/13/19) 2 yr ago ?(12/02/18) 2 yr ago ?(10/09/18) 2 yr ago ?(09/17/18)  ?TSH 0.35 - 5.50 uIU/mL 0.00 Repeated and verified X2. Low   2.19  1.180 R  4.740 High  R  2.34 R  <0.006 Low  R  0.018   ? ?Assessment & Plan:  ? ?1. Bleeding internal hemorrhoids ?We discussed Dr. Frankey Meyers previous recommendations. I will renew the prescription for Anusol HC suppositories. I will refer [name] back to Sergio Meyers to discuss a possible colonoscopy and/or hemorrhoidal banding procedure. ? ?- hydrocortisone (ANUSOL-HC) 25 MG  suppository; Place 1 suppository (25 mg total) rectally 2 (two) times daily.  Dispense: 12 suppository; Refill: 0 ?- Ambulatory referral to Gastroenterology ? ?2. Hyperthyroidism ?[name]'s labs at his last visit did show an elevated T4, very low TSH and and significant thyroid peroxidase antibodies. The record shows Dr. Lonzo Meyers declined the referral, noting "I saw him in 2020 and he has not been back since".  This seems at odds with her consult note from 2020 where she noted the patient should follow with his PCP and see her "PRN". I will reach out to Dr. Lonzo Meyers to reconsider this referral. ? ?Return if symptoms worsen or fail to improve.  ? ?Loyola Mast, MD ?

## 2021-09-04 NOTE — Telephone Encounter (Signed)
Thank you. Dm/cma  

## 2021-09-07 NOTE — Progress Notes (Signed)
Patient scheduled for 09/10/21 @ 3pm ?

## 2021-09-10 ENCOUNTER — Ambulatory Visit (INDEPENDENT_AMBULATORY_CARE_PROVIDER_SITE_OTHER): Payer: BC Managed Care – PPO | Admitting: Internal Medicine

## 2021-09-10 ENCOUNTER — Encounter: Payer: Self-pay | Admitting: Internal Medicine

## 2021-09-10 VITALS — BP 120/70 | HR 65 | Ht 68.0 in | Wt 181.0 lb

## 2021-09-10 DIAGNOSIS — E0590 Thyrotoxicosis, unspecified without thyrotoxic crisis or storm: Secondary | ICD-10-CM | POA: Diagnosis not present

## 2021-09-10 DIAGNOSIS — E069 Thyroiditis, unspecified: Secondary | ICD-10-CM | POA: Insufficient documentation

## 2021-09-10 MED ORDER — METHIMAZOLE 5 MG PO TABS: 5.0000 mg | ORAL_TABLET | Freq: Every day | ORAL | 2 refills | Status: DC

## 2021-09-10 NOTE — Progress Notes (Signed)
? ? ?Name: Sergio Meyers  ?MRN/ DOB: [id number], 06/20/1995    ?Age/ Sex: 26 y.o., male   ? ?PCP: Haydee Salter, MD   ?Reason for Endocrinology Evaluation: Hyperthyroidism  ?   ?Date of Initial Endocrinology Evaluation: 12/02/2018  ? ? ?HPI: ?Sergio Meyers is a 26 y.o. male with unremarkable past medical history . The patient presented for initial endocrinology clinic visit on 12/02/2018 for consultative assistance with his Hyperthyroidism ? ?Pt was noted to have a low TSH at  0.018 uIU/mL  , during evaluation of worsening of chronic tremors in 08/2018. Repeat labs in June, 2020 showed suppressed TSH < 0.006 uIU/mL with elevated FT4 and T3.  ? ? ?By his visit with me in August 2020 his TFTs have normalized and this was attributed to subacute thyroiditis as prior to that he presented to the ED with fever ? ? ?By March 2023 he was noted with hyperthyroidism with a suppressed THS at 0.00u IU/mL, elevated free T4 at 4.25 NG/DL ? ? ?He was noted with worsening hand tremors  ?He denies weight loss  ?Denies loose stools or diarrhea  ?Denies local neck swelling  ?No biotin intake  ? ? ?He is on atenolol 50 mg daily  ? ? ? ?No FH of thyroid disease  ? ? ?HISTORY:  ?Past Medical History:  ?Past Medical History:  ?Diagnosis Date  ? Depression   ? ?Past Surgical History: No past surgical history on file.  ?Social History:  reports that he has never smoked. He has never used smokeless tobacco. He reports current alcohol use. He reports that he does not use drugs. ?Family History: family history includes CAD in his paternal grandfather; Diabetes in his father, paternal aunt, and paternal grandfather; Hypertension in his father and paternal grandfather. ? ? ?HOME MEDICATIONS: ?Allergies as of 09/10/2021   ? ?   Reactions  ? Amoxicillin   ? Unsure reaction-happened at childbirth  ? ?  ? ?  ?Medication List  ?  ? ?  ? Accurate as of Sep 10, 2021  3:35 PM. If you have any questions, ask your nurse or  doctor.  ?  ?  ? ?  ? ?atenolol 50 MG tablet ?Commonly known as: TENORMIN ?Take 1 tablet (50 mg total) by mouth daily. ?  ?FLUoxetine 20 MG tablet ?Commonly known as: PROZAC ?Take 1 tablet (20 mg total) by mouth daily. ?  ?hydrocortisone 25 MG suppository ?Commonly known as: ANUSOL-HC ?Place 1 suppository (25 mg total) rectally 2 (two) times daily. ?  ?methimazole 5 MG tablet ?Commonly known as: TAPAZOLE ?Take 1 tablet (5 mg total) by mouth daily. ?Started by: Dorita Sciara, MD ?  ?vitamin D3 50 MCG (2000 UT) Caps ?Take 1 capsule by mouth daily. ?  ? ?  ?  ? ? ?REVIEW OF SYSTEMS: ?A comprehensive ROS was conducted with the patient and is negative except as per HPI  ? ? ? ?OBJECTIVE:  ?VS: BP 120/70 (BP Location: Left Arm, Patient Position: Sitting, Cuff Size: Small)   Pulse 65   Ht 5\' 8"  (1.727 m)   Wt 181 lb (82.1 kg)   SpO2 99%   BMI 27.52 kg/m?   ? ?Wt Readings from Last 3 Encounters:  ?09/10/21 181 lb (82.1 kg)  ?09/04/21 180 lb (81.6 kg)  ?07/02/21 173 lb 3.2 oz (78.6 kg)  ? ? ? ?EXAM: ?General: Pt appears well and is in NAD  ?Eyes: External eye exam normal without stare,  lid lag or exophthalmos.  EOM intact.   ?Neck: General: Supple without adenopathy. ?Thyroid: Thyroid size normal.  No goiter or nodules appreciated. No thyroid bruit.  ?Lungs: Clear with good BS bilat with no rales, rhonchi, or wheezes  ?Heart: Auscultation: RRR.  ?Abdomen: Normoactive bowel sounds, soft, nontender, without masses or organomegaly palpable  ?Extremities:  ?BL LE: No pretibial edema normal ROM and strength.  ?Mental Status: Judgment, insight: Intact ?Orientation: Oriented to time, place, and person ?Mood and affect: No depression, anxiety, or agitation  ? ? ? ?DATA REVIEWED: ? ? Latest Reference Range & Units 09/10/21 17:20  ?TSH 0.35 - 5.50 uIU/mL 7.71 (H)  ?Triiodothyronine (T3) 76 - 181 ng/dL 156  ?T4,Free(Direct) 0.60 - 1.60 ng/dL 0.69  ? ? ? Latest Reference Range & Units 07/02/21 09:30  ?Thyroperoxidase Ab  SerPl-aCnc <9 IU/mL 73 (H)  ? ?TSI  Negative  ? ?Thyroid Ultrasound (10/19/2018) ? ?Mildly enlarged and heterogeneous thyroid gland suggestive of ?underlying thyroiditis. Small hypoechoic area along the anterior ?aspect of the right isthmus is not concerning, as above. ? ?Old records , labs and images have been reviewed.  ? ?ASSESSMENT/PLAN/RECOMMENDATIONS:  ? ?Hyperthyroidism : ? ?- Clinically euthyroid at this time ?- We discussed D/D of graves' disease vs autonomous nodule(s) vs thyroiditis.  ?- Two months ago he was diagnosed with hyperthyroidism through his PCP, he was started on atenolol at the time ?-I initially discussed starting you on methimazole, pending his lab results but his TSH today was elevated at 7.71u IU/mL with normalization of his free T4 and T3 ?-The differential diagnosis remains to be subacute thyroiditis (due to quick resolution of hyperthyroidism ) but Graves' disease is another differential even though clinically it does not improve that quickly ?-I have contacted the patient on 09/11/2021 and asked him to not take any methimazole ?-We will repeat TFTs in a month which will be June 8 at 8:30 AM, and monitor hypothyroid status ? ? ? ? ?F/U 4 months ? ?Signed electronically by: ?Abby Nena Jordan, MD ? ?[facility]  ?[facility] ?[address] ?[place], [place] [address] ?Phone: [phone] ?[id number] ? ? ?CC: ?Rudd, Lillette Boxer, MD ?[place] ?[place] [address] ?Phone: [phone] ?Fax: [phone] ? ? ?Return to Endocrinology clinic as below: ?No future appointments. ?  ? ? ? ? ? ?

## 2021-09-11 LAB — T4, FREE: Free T4: 0.69 ng/dL (ref 0.60–1.60)

## 2021-09-11 LAB — TSH: TSH: 7.71 u[IU]/mL — ABNORMAL HIGH (ref 0.35–5.50)

## 2021-09-13 LAB — TRAB (TSH RECEPTOR BINDING ANTIBODY): TRAB: <1 IU/L (ref <=2.00)

## 2021-09-13 LAB — T3: T3, Total: 156 ng/dL (ref 76–181)

## 2021-10-01 ENCOUNTER — Other Ambulatory Visit: Payer: Self-pay | Admitting: Internal Medicine

## 2021-10-01 DIAGNOSIS — E0590 Thyrotoxicosis, unspecified without thyrotoxic crisis or storm: Secondary | ICD-10-CM

## 2021-10-04 ENCOUNTER — Other Ambulatory Visit (INDEPENDENT_AMBULATORY_CARE_PROVIDER_SITE_OTHER): Payer: BC Managed Care – PPO

## 2021-10-04 DIAGNOSIS — E0590 Thyrotoxicosis, unspecified without thyrotoxic crisis or storm: Secondary | ICD-10-CM

## 2021-10-04 LAB — T4, FREE: Free T4: 0.73 ng/dL (ref 0.60–1.60)

## 2021-10-04 LAB — TSH: TSH: 2.78 u[IU]/mL (ref 0.35–5.50)

## 2021-10-05 LAB — T3: T3, Total: 122 ng/dL (ref 76–181)

## 2021-11-09 ENCOUNTER — Other Ambulatory Visit: Payer: Self-pay | Admitting: Internal Medicine

## 2021-11-09 DIAGNOSIS — E0590 Thyrotoxicosis, unspecified without thyrotoxic crisis or storm: Secondary | ICD-10-CM

## 2021-11-14 ENCOUNTER — Other Ambulatory Visit: Payer: BC Managed Care – PPO

## 2021-12-11 ENCOUNTER — Ambulatory Visit (INDEPENDENT_AMBULATORY_CARE_PROVIDER_SITE_OTHER): Payer: BC Managed Care – PPO | Admitting: Gastroenterology

## 2021-12-11 VITALS — BP 110/70 | HR 75 | Ht 68.0 in | Wt 187.0 lb

## 2021-12-11 DIAGNOSIS — K921 Melena: Secondary | ICD-10-CM

## 2021-12-11 DIAGNOSIS — R195 Other fecal abnormalities: Secondary | ICD-10-CM

## 2021-12-11 MED ORDER — CLENPIQ 10-3.5-12 MG-GM -GM/160ML PO SOLN: 1.0000 | ORAL | 0 refills | Status: DC

## 2021-12-11 NOTE — Progress Notes (Signed)
Chief Complaint: Hematochezia, rectal discomfort, internal hemorrhoids   Referring Provider:     Loyola Mast, MD    HPI:     Sergio Meyers is a 26 y.o. male with history of Hashimoto's thyroiditis, depression, referred to the Gastroenterology Clinic for evaluation of hematochezia.  He was previously seen by me on 12/28/2019 for similar symptoms and diagnosed with internal hemorrhoids on anoscopy.  Patient elected for conservative management with trial of Anusol suppository, increase dietary fiber, fiber supplement, hydration, with plan to return to GI clinic if recurrence.  Symptoms started to recur earlier this year.  He was seen by his PCM on 09/04/2021 and prescribed Anusol suppository and referred back to the GI clinic.  Today, he states he has been having stuttering symptoms since last appointment with me, but increasing earlier this year.  Describes intermittent BRBPR along with occasional mucus-like stools, which occurs less frequently than the hematochezia and can occur in the absence of hematochezia.  Sxs have resolved again since latest round of Anusol. No abdominal pain, change in stool frequency, wt loss. No UGI sxs.   No stool softener or fiber supplement.   Has been following in the Endocrinology clinic as well, last seen on 09/10/2021 for hyperthyroidism.  No known family history of CRC, GI malignancy, liver disease, pancreatic disease, or IBD.       Latest Ref Rng & Units 07/02/2021    9:30 AM 12/08/2019   10:56 AM 09/13/2019    8:05 AM  CBC  WBC 4.0 - 10.5 K/uL 3.9  4.8  7.9   Hemoglobin 13.0 - 17.0 g/dL 29.5  18.8  41.6   Hematocrit 39.0 - 52.0 % 40.7  44.9  44.6   Platelets 150.0 - 400.0 K/uL 313.0  281  322       Latest Ref Rng & Units 07/02/2021    9:30 AM 06/13/2018    1:20 AM  CMP  Glucose 70 - 99 mg/dL 96  86   BUN 6 - 23 mg/dL 13  12   Creatinine 6.06 - 1.50 mg/dL 3.01  6.01   Sodium 093 - 145 mEq/L 139  136   Potassium 3.5  - 5.1 mEq/L 3.5  3.8   Chloride 96 - 112 mEq/L 104  102   CO2 19 - 32 mEq/L 27  24   Calcium 8.4 - 10.5 mg/dL 9.6  8.7   Total Protein 6.0 - 8.3 g/dL 7.0  7.3   Total Bilirubin 0.2 - 1.2 mg/dL 0.7  0.5   Alkaline Phos 39 - 117 U/L 48  44   AST 0 - 37 U/L 24  18   ALT 0 - 53 U/L 39  21      Past Medical History:  Diagnosis Date   Depression      Past Surgical History:  Procedure Laterality Date   NO PAST SURGERIES     Family History  Problem Relation Age of Onset   Diabetes Father    Hypertension Father    Diabetes Paternal Aunt    Diabetes Paternal Grandfather    Hypertension Paternal Grandfather    CAD Paternal Grandfather    Colon cancer Neg Hx    Esophageal cancer Neg Hx    Social History   Tobacco Use   Smoking status: Never   Smokeless tobacco: Never  Vaping Use   Vaping Use: Never used  Substance Use Topics  Alcohol use: Yes    Comment: rare   Drug use: Never   Current Outpatient Medications  Medication Sig Dispense Refill   atenolol (TENORMIN) 50 MG tablet Take 50 mg by mouth daily.     FLUoxetine (PROZAC) 20 MG tablet Take 1 tablet (20 mg total) by mouth daily. 90 tablet 3   Vitamin D, Cholecalciferol, 50 MCG (2000 UT) CAPS Take 1 capsule by mouth daily.     No current facility-administered medications for this visit.   Allergies  Allergen Reactions   Amoxicillin     Unsure reaction-happened at childbirth     Review of Systems: All systems reviewed and negative except where noted in HPI.     Physical Exam:    Wt Readings from Last 3 Encounters:  12/11/21 187 lb (84.8 kg)  09/10/21 181 lb (82.1 kg)  09/04/21 180 lb (81.6 kg)    BP 110/70 (Cuff Size: Normal)   Pulse 75   Ht 5\' 8"  (1.727 m)   Wt 187 lb (84.8 kg)   BMI 28.43 kg/m  Constitutional:  Pleasant, in no acute distress. Psychiatric: Normal mood and affect. Behavior is normal. Cardiovascular: Normal rate, regular rhythm. No edema Pulmonary/chest: Effort normal and breath  sounds normal. No wheezing, rales or rhonchi. Abdominal: Soft, nondistended, nontender. Bowel sounds active throughout. There are no masses palpable. No hepatomegaly. Neurological: Alert and oriented to person place and time. Skin: Skin is warm and dry. No rashes noted.   ASSESSMENT AND PLAN;   1) Hematochezia 2) Mucus-like stools  - Colonoscopy to evaluate for more proximal etiology of bleeding, r/o IBD, along with evaluation of grade/severity of hemorrhoids - Discussed hemorrhoid banding pending colonoscopy findings - Continue adequate hydration, increase dietary fiber  The indications, risks, and benefits of colonoscopy were explained to the patient in detail. Risks include but are not limited to bleeding, perforation, adverse reaction to medications, and cardiopulmonary compromise. Sequelae include but are not limited to the possibility of surgery, hospitalization, and mortality. The patient verbalized understanding and wished to proceed. All questions answered, referred to the scheduler and bowel prep ordered. Further recommendations pending results of the exam.     , DO, FACG  12/11/2021, 8:55 AM   12/13/2021, Veto Kemps, MD

## 2021-12-11 NOTE — Patient Instructions (Signed)
If you are age 27 or younger, your body mass index should be between 19-25. Your Body mass index is 28.43 kg/m. If this is out of the aformentioned range listed, please consider follow up with your Primary Care Provider.  ________________________________________________________  The [facility] providers would like to encourage you to use [facility] to communicate with providers for non-urgent requests or questions.  Due to long hold times on the telephone, sending your provider a message by [facility] may be a faster and more efficient way to get a response.  Please allow 48 business hours for a response.  Please remember that this is for non-urgent requests.  _______________________________________________________  Bonita Quin have been scheduled for a colonoscopy. Please follow written instructions given to you at your visit today.  Please pick up your prep supplies at the pharmacy within the next 1-3 days. If you use inhalers (even only as needed), please bring them with you on the day of your procedure.  Follow up pending the results of your Colonoscopy.  Thank you for entrusting me with your care and choosing [facility].  Dr Barron Alvine

## 2021-12-18 ENCOUNTER — Encounter: Payer: Self-pay | Admitting: Certified Registered Nurse Anesthetist

## 2021-12-18 ENCOUNTER — Encounter: Payer: Self-pay | Admitting: Gastroenterology

## 2021-12-25 ENCOUNTER — Encounter: Payer: Self-pay | Admitting: Gastroenterology

## 2021-12-25 ENCOUNTER — Ambulatory Visit (AMBULATORY_SURGERY_CENTER): Payer: BC Managed Care – PPO | Admitting: Gastroenterology

## 2021-12-25 VITALS — BP 105/56 | HR 63 | Temp 99.3°F | Resp 14 | Ht 68.0 in | Wt 187.0 lb

## 2021-12-25 DIAGNOSIS — K640 First degree hemorrhoids: Secondary | ICD-10-CM | POA: Diagnosis not present

## 2021-12-25 DIAGNOSIS — R194 Change in bowel habit: Secondary | ICD-10-CM

## 2021-12-25 DIAGNOSIS — K921 Melena: Secondary | ICD-10-CM | POA: Diagnosis not present

## 2021-12-25 DIAGNOSIS — R195 Other fecal abnormalities: Secondary | ICD-10-CM

## 2021-12-25 DIAGNOSIS — K648 Other hemorrhoids: Secondary | ICD-10-CM

## 2021-12-25 HISTORY — PX: COLONOSCOPY: SHX174

## 2021-12-25 MED ORDER — SODIUM CHLORIDE 0.9 % IV SOLN: 500.0000 mL | Freq: Once | INTRAVENOUS | Status: DC

## 2021-12-25 NOTE — Progress Notes (Signed)
Pt seen in the office 12/11/2021. Please see note from that day for full H&P and details. Otherwise, no change in clinical history in the interim.

## 2021-12-25 NOTE — Progress Notes (Deleted)
Report given to PACU, vss 

## 2021-12-25 NOTE — Progress Notes (Signed)
Pt's states no medical or surgical changes since previsit or office visit. 

## 2021-12-25 NOTE — Progress Notes (Signed)
Report given to PACU, vss 

## 2021-12-25 NOTE — Op Note (Signed)
[place] Patient Name: Sergio Meyers Procedure Date: 12/25/2021 2:08 PM MRN: [id number] Endoscopist: Gerrit Heck , MD Age: 26 Referring MD:  Date of Birth: 1995-06-07 Gender: Male Account #: [id number] Procedure:                Colonoscopy Indications:              Hematochezia, Change in bowel habits, Mucus-like                            stools Medicines:                Monitored Anesthesia Care Procedure:                Pre-Anesthesia Assessment:                           - Prior to the procedure, a History and Physical                            was performed, and patient medications and                            allergies were reviewed. The patient's tolerance of                            previous anesthesia was also reviewed. The risks                            and benefits of the procedure and the sedation                            options and risks were discussed with the patient.                            All questions were answered, and informed consent                            was obtained. Prior Anticoagulants: The patient has                            taken no previous anticoagulant or antiplatelet                            agents. ASA Grade Assessment: I - A normal, healthy                            patient. After reviewing the risks and benefits,                            the patient was deemed in satisfactory condition to                            undergo the procedure.  After obtaining informed consent, the colonoscope                            was passed under direct vision. Throughout the                            procedure, the patient's blood pressure, pulse, and                            oxygen saturations were monitored continuously. The                            CF [id number] #[id number] was introduced through the anus                            and advanced to the 10 cm into the ileum. The                             colonoscopy was performed without difficulty. The                            patient tolerated the procedure well. The quality                            of the bowel preparation was good. The terminal                            ileum, ileocecal valve, appendiceal orifice, and                            rectum were photographed. Scope In: 2:11:20 PM Scope Out: 2:27:55 PM Scope Withdrawal Time: 0 hours 13 minutes 44 seconds  Total Procedure Duration: 0 hours 16 minutes 35 seconds  Findings:                 The perianal and digital rectal examinations were                            normal.                           The entire colon appeared normal. Biopsies for                            histology were taken with a cold forceps from the                            right colon and left colon for evaluation of                            microscopic colitis. Estimated blood loss was                            minimal.  Non-bleeding internal hemorrhoids were found during                            retroflexion. The hemorrhoids were small and Grade                            I (internal hemorrhoids that do not prolapse).                           The terminal ileum appeared normal. Complications:            No immediate complications. Estimated Blood Loss:     Estimated blood loss was minimal. Impression:               - The entire examined colon is normal. Biopsied.                           - Non-bleeding internal hemorrhoids.                           - The examined portion of the ileum was normal. Recommendation:           - Patient has a contact number available for                            emergencies. The signs and symptoms of potential                            delayed complications were discussed with the                            patient. Return to normal activities tomorrow.                            Written discharge instructions were  provided to the                            patient.                           - Resume previous diet.                           - Continue present medications.                           - Await pathology results.                           - Repeat colonoscopy at age 56 for screening                            purposes.                           - Use fiber, for example Citrucel, Fibercon, [employer]  or Metamucil.                           - Return to GI clinic PRN. Gerrit Heck, MD 12/25/2021 2:32:49 PM

## 2021-12-25 NOTE — Patient Instructions (Addendum)
Handout on hemorrhoids provided   Await pathology results.   Continue current medications. Use fiber, for example Citrucel, Fibercon, Konsyl or Metamucil.  Repeat colonoscopy at age 26 for screening purposes   YOU HAD AN ENDOSCOPIC PROCEDURE TODAY AT THE [facility]:   Refer to the procedure report that was given to you for any specific questions about what was found during the examination.  If the procedure report does not answer your questions, please call your gastroenterologist to clarify.  If you requested that your care partner not be given the details of your procedure findings, then the procedure report has been included in a sealed envelope for you to review at your convenience later.  YOU SHOULD EXPECT: Some feelings of bloating in the abdomen. Passage of more gas than usual.  Walking can help get rid of the air that was put into your GI tract during the procedure and reduce the bloating. If you had a lower endoscopy (such as a colonoscopy or flexible sigmoidoscopy) you may notice spotting of blood in your stool or on the toilet paper. If you underwent a bowel prep for your procedure, you may not have a normal bowel movement for a few days.  Please Note:  You might notice some irritation and congestion in your nose or some drainage.  This is from the oxygen used during your procedure.  There is no need for concern and it should clear up in a day or so.  SYMPTOMS TO REPORT IMMEDIATELY:  Following lower endoscopy (colonoscopy or flexible sigmoidoscopy):  Excessive amounts of blood in the stool  Significant tenderness or worsening of abdominal pains  Swelling of the abdomen that is new, acute  Fever of 100F or higher   For urgent or emergent issues, a gastroenterologist can be reached at any hour by calling [phone]. Do not use MyChart messaging for urgent concerns.    DIET:  We do recommend a small meal at first, but then you may proceed to your regular diet.   Drink plenty of fluids but you should avoid alcoholic beverages for 24 hours.  ACTIVITY:  You should plan to take it easy for the rest of today and you should NOT DRIVE or use heavy machinery until tomorrow (because of the sedation medicines used during the test).    FOLLOW UP: Our staff will call the number listed on your records the next business day following your procedure.  We will call around 7:15- 8:00 am to check on you and address any questions or concerns that you may have regarding the information given to you following your procedure. If we do not reach you, we will leave a message.  If you develop any symptoms (ie: fever, flu-like symptoms, shortness of breath, cough etc.) before then, please call [phone].  If you test positive for Covid 19 in the 2 weeks post procedure, please call and report this information to [place].    If any biopsies were taken you will be contacted by phone or by letter within the next 1-3 weeks.  Please call us at [phone] if you have not heard about the biopsies in 3 weeks.    SIGNATURES/CONFIDENTIALITY: You and/or your care partner have signed paperwork which will be entered into your electronic medical record.  These signatures attest to the fact that that the information above on your After Visit Summary has been reviewed and is understood.  Full responsibility of the confidentiality of this discharge information lies with you and/or your  care-partner.

## 2021-12-25 NOTE — Progress Notes (Signed)
Called to room to assist during endoscopic procedure.  Patient ID and intended procedure confirmed with present staff. Received instructions for my participation in the procedure from the performing physician.  

## 2021-12-26 ENCOUNTER — Telehealth: Payer: Self-pay

## 2021-12-26 NOTE — Telephone Encounter (Signed)
Attempted to reach pt. With follow-up call following endoscopic procedure 12/25/2021.  LM On pt. Voice mail to call if he has any questions or concerns. 

## 2022-01-16 ENCOUNTER — Ambulatory Visit: Payer: BC Managed Care – PPO | Admitting: Internal Medicine

## 2022-01-16 NOTE — Progress Notes (Deleted)
Name: Sergio Meyers  MRN/ DOB: [id number], [birth year]    Age/ Sex: 26 y.o., male    PCP: Haydee Salter, MD   Reason for Endocrinology Evaluation: Hyperthyroidism     Date of Initial Endocrinology Evaluation: 12/02/2018    HPI: [name] is a 26 y.o. male with unremarkable past medical history . The patient presented for initial endocrinology clinic visit on 12/02/2018 for consultative assistance with his Hyperthyroidism  Pt was noted to have a low TSH at  0.018 uIU/mL  , during evaluation of worsening of chronic tremors in 08/2018. Repeat labs in June, 2020 showed suppressed TSH < 0.006 uIU/mL with elevated FT4 and T3.    By his visit with me in August 2020 his TFTs have normalized and this was attributed to subacute thyroiditis as prior to that he presented to the ED with fever   By March 2023 he was noted with hyperthyroidism with a suppressed THS at 0.00u IU/mL, elevated free T4 at 4.25 NG/DL  By May 2023 his TFTs have normalized without any treatment   Patient with elevated anti-TPO antibody at 73 IU/mL TSI has been undetectable TRAb undetectable  Thyroid ultrasound unrevealing of any thyroid nodules 09/2018  No FH of thyroid disease     SUBJECTIVE:    Today (01/16/22):  Daruis Swaim Norsworthy Meyers is here for follow-up on history of thyroid disease.    He was noted with worsening hand tremors  He denies weight loss  Denies loose stools or diarrhea  Denies local neck swelling  No biotin intake    He is on atenolol 50 mg daily     HISTORY:  Past Medical History:  Past Medical History:  Diagnosis Date   Depression    Past Surgical History:  Past Surgical History:  Procedure Laterality Date   COLONOSCOPY  12/25/2021   VC at [facility], hematochezia   NO PAST SURGERIES      Social History:  reports that he has never smoked. He has never used smokeless tobacco. He reports current alcohol use. He reports that he does not use  drugs. Family History: family history includes CAD in his paternal grandfather; Diabetes in his father, paternal aunt, and paternal grandfather; Hypertension in his father and paternal grandfather.   HOME MEDICATIONS: Allergies as of 01/16/2022       Reactions   Amoxicillin    Unsure reaction-happened at childbirth        Medication List        Accurate as of January 16, 2022  7:28 AM. If you have any questions, ask your nurse or doctor.          atenolol 50 MG tablet Commonly known as: TENORMIN Take 50 mg by mouth daily.   FLUoxetine 20 MG tablet Commonly known as: PROZAC Take 1 tablet (20 mg total) by mouth daily.   vitamin D3 50 MCG (2000 UT) Caps Take 1 capsule by mouth daily.          REVIEW OF SYSTEMS: A comprehensive ROS was conducted with the patient and is negative except as per HPI     OBJECTIVE:  VS: There were no vitals taken for this visit.   Wt Readings from Last 3 Encounters:  12/25/21 187 lb (84.8 kg)  12/11/21 187 lb (84.8 kg)  09/10/21 181 lb (82.1 kg)     EXAM: General: Pt appears well and is in NAD  Eyes: External eye exam normal without stare, lid lag  or exophthalmos.  EOM intact.   Neck: General: Supple without adenopathy. Thyroid: Thyroid size normal.  No goiter or nodules appreciated. No thyroid bruit.  Lungs: Clear with good BS bilat with no rales, rhonchi, or wheezes  Heart: Auscultation: RRR.  Abdomen: Normoactive bowel sounds, soft, nontender, without masses or organomegaly palpable  Extremities:  BL LE: No pretibial edema normal ROM and strength.  Mental Status: Judgment, insight: Intact Orientation: Oriented to time, place, and person Mood and affect: No depression, anxiety, or agitation     DATA REVIEWED:   Latest Reference Range & Units 09/10/21 17:20  TSH 0.35 - 5.50 uIU/mL 7.71 (H)  Triiodothyronine (T3) 76 - 181 ng/dL 601  U9,NATF(TDDUKG) 2.54 - 1.60 ng/dL 2.70     Latest Reference Range & Units  07/02/21 09:30  Thyroperoxidase Ab SerPl-aCnc <9 IU/mL 73 (H)   TSI  Negative   Thyroid Ultrasound (10/19/2018)  Mildly enlarged and heterogeneous thyroid gland suggestive of underlying thyroiditis. Small hypoechoic area along the anterior aspect of the right isthmus is not concerning, as above.  Old records , labs and images have been reviewed.   ASSESSMENT/PLAN/RECOMMENDATIONS:   Hyperthyroidism :  - Clinically euthyroid at this time - We discussed D/D of graves' disease vs autonomous nodule(s) vs thyroiditis.  - Two months ago he was diagnosed with hyperthyroidism through his PCP, he was started on atenolol at the time -I initially discussed starting you on methimazole, pending his lab results but his TSH today was elevated at 7.71u IU/mL with normalization of his free T4 and T3 -The differential diagnosis remains to be subacute thyroiditis (due to quick resolution of hyperthyroidism ) but Graves' disease is another differential even though clinically it does not improve that quickly -I have contacted the patient on 09/11/2021 and asked him to not take any methimazole -We will repeat TFTs in a month which will be June 8 at 8:30 AM, and monitor hypothyroid status     F/U 4 months  Signed electronically by: Lyndle Herrlich, MD  [facility] Endocrinology  [facility] [address] [place] [place], [place] [address] Phone: [phone] FAX: [phone]   CC: Loyola Mast, MD [address] [place] [address] Phone: [phone] Fax: [phone]   Return to Endocrinology clinic as below: Future Appointments  Date Time Provider Department Center  01/16/2022  7:50 AM Reina Wilton, Konrad Dolores, MD [id number] None

## 2022-03-11 ENCOUNTER — Other Ambulatory Visit: Payer: Self-pay | Admitting: Family Medicine

## 2022-03-11 DIAGNOSIS — F321 Major depressive disorder, single episode, moderate: Secondary | ICD-10-CM

## 2022-03-18 ENCOUNTER — Encounter: Payer: Self-pay | Admitting: Family Medicine

## 2022-03-18 DIAGNOSIS — F321 Major depressive disorder, single episode, moderate: Secondary | ICD-10-CM

## 2022-03-19 MED ORDER — FLUOXETINE HCL 20 MG PO TABS: 20.0000 mg | ORAL_TABLET | Freq: Every day | ORAL | 3 refills | Status: DC

## 2022-04-25 ENCOUNTER — Other Ambulatory Visit: Payer: Self-pay | Admitting: Family Medicine

## 2022-04-25 DIAGNOSIS — E0590 Thyrotoxicosis, unspecified without thyrotoxic crisis or storm: Secondary | ICD-10-CM

## 2022-05-24 ENCOUNTER — Telehealth (INDEPENDENT_AMBULATORY_CARE_PROVIDER_SITE_OTHER): Payer: BC Managed Care – PPO | Admitting: Family Medicine

## 2022-05-24 ENCOUNTER — Encounter: Payer: Self-pay | Admitting: Family Medicine

## 2022-05-24 VITALS — Temp 98.4°F | Ht 68.0 in | Wt 170.0 lb

## 2022-05-24 DIAGNOSIS — E069 Thyroiditis, unspecified: Secondary | ICD-10-CM | POA: Diagnosis not present

## 2022-05-24 DIAGNOSIS — U071 COVID-19: Secondary | ICD-10-CM | POA: Diagnosis not present

## 2022-05-24 MED ORDER — NIRMATRELVIR/RITONAVIR (PAXLOVID)TABLET: 3.0000 | ORAL_TABLET | Freq: Two times a day (BID) | ORAL | 0 refills | Status: AC

## 2022-05-24 NOTE — Progress Notes (Signed)
[facility] [address] [place] [address] Dept: [phone] Dept Fax: [phone]  Virtual Video Visit  I connected with Sergio Meyers on 05/24/22 at 10:20 AM EST by a video enabled telemedicine application and verified that I am speaking with the correct person using two identifiers.  Location patient: Home Location provider: Clinic Persons participating in the virtual visit: Patient, Provider  I discussed the limitations of evaluation and management by telemedicine and the availability of in person appointments. The patient expressed understanding and agreed to proceed.  Chief Complaint  Patient presents with   Acute Visit    C/o having ST, Cough x 2-3 days.  Has taken NyQuil.  Tested positive for Covid today.    SUBJECTIVE:  HPI: Sergio Meyers is a 27 y.o. male who presents with a 2-day history of cough, sore throat, and mild headache. He has not had any fever, body aches, or loss of appetite. He is using cough drops and taking Nyquil.  Sergio Meyers has a history of Hashimoto's thyroiditis. He has had transient episodes that can cause tachycardia and tremor. he continues to manage on atenolol. He notes he has persistent tremor. He was last seen by Dr. Kelton Pillar in May and his thyroid studies were normal in June.  Patient Active Problem List   Diagnosis Date Noted   Thyroiditis, transient 09/10/2021   Depression, recurrent ([place]) 01/09/2021   History of Hashimoto thyroiditis 01/09/2021   Past Surgical History:  Procedure Laterality Date   COLONOSCOPY  12/25/2021   VC at [facility], hematochezia   NO PAST SURGERIES     Family History  Problem Relation Age of Onset   Diabetes Father    Hypertension Father    Diabetes Paternal Aunt    Diabetes Paternal Grandfather    Hypertension Paternal Grandfather    CAD Paternal Grandfather    Colon cancer Neg Hx    Esophageal cancer Neg Hx    Social  History   Tobacco Use   Smoking status: Never   Smokeless tobacco: Never  Vaping Use   Vaping Use: Never used  Substance Use Topics   Alcohol use: Yes    Comment: rare   Drug use: Never    Current Outpatient Medications:    atenolol (TENORMIN) 50 MG tablet, TAKE 1 TABLET BY MOUTH EVERY DAY, Disp: 90 tablet, Rfl: 3   FLUoxetine (PROZAC) 20 MG tablet, Take 1 tablet (20 mg total) by mouth daily., Disp: 90 tablet, Rfl: 3   nirmatrelvir/ritonavir (PAXLOVID) 20 x 150 MG & 10 x 100MG  TABS, Take 3 tablets by mouth 2 (two) times daily for 5 days. (Take nirmatrelvir 150 mg two tablets twice daily for 5 days and ritonavir 100 mg one tablet twice daily for 5 days) Patient GFR is 137, Disp: 30 tablet, Rfl: 0   Vitamin D, Cholecalciferol, 50 MCG (2000 UT) CAPS, Take 1 capsule by mouth daily., Disp: , Rfl:   Allergies  Allergen Reactions   Amoxicillin     Unsure reaction-happened at childbirth   ROS: See pertinent positives and negatives per HPI.  OBSERVATIONS/OBJECTIVE:  VITALS per patient if applicable: Today's Vitals   05/24/22 1001  Temp: 98.4 F (36.9 C)  TempSrc: Temporal  Weight: 170 lb (77.1 kg)  Height: 5\' 8"  (1.727 m)   Body mass index is 25.85 kg/m.    GENERAL: Alert and oriented. Appears well and in no acute distress.  HEENT: Atraumatic. Eyes clear. No obvious abnormalities on  inspection of external nose and ears.  NECK: Normal movements of the head and neck.  LUNGS: On inspection, no signs of respiratory distress. Breathing rate appears normal. No obvious gross SOB, gasping or wheezing, and no conversational dyspnea.  CV: No obvious cyanosis.  PSYCH/NEURO: Pleasant and cooperative. No obvious depression or anxiety. Speech and thought processing grossly intact.  ASSESSMENT AND PLAN:  Problem List Items Addressed This Visit       Endocrine   Thyroiditis, transient    I recommend he follow-up for an in-person visit to reassess his thyroid. We will plan repeat  testing. He should continue atenolol 50 mg daily.        Other   COVID-19 - Primary    Reviewed home care instructions for COVID. Advised self-isolation at home for at least 5 days. After 5 days, if improved and fever resolved, can be in public, but should wear a mask around others for an additional 5 days. If symptoms, esp, dyspnea develops/worsens, recommend in-person evaluation at either an urgent care or the emergency room.       Relevant Medications   nirmatrelvir/ritonavir (PAXLOVID) 20 x 150 MG & 10 x 100MG  TABS     I discussed the assessment and treatment plan with the patient. The patient was provided an opportunity to ask questions and all were answered. The patient agreed with the plan and demonstrated an understanding of the instructions.   The patient was advised to call back or seek an in-person evaluation if the symptoms worsen or if the condition fails to improve as anticipated.  Return for Reassessment of thyroid.   Haydee Salter, MD

## 2022-05-24 NOTE — Assessment & Plan Note (Signed)
Reviewed home care instructions for COVID. Advised self-isolation at home for at least 5 days. After 5 days, if improved and fever resolved, can be in public, but should wear a mask around others for an additional 5 days. If symptoms, esp, dyspnea develops/worsens, recommend in-person evaluation at either an urgent care or the emergency room.    

## 2022-05-24 NOTE — Assessment & Plan Note (Signed)
I recommend he follow-up for an in-person visit to reassess his thyroid. We will plan repeat testing. He should continue atenolol 50 mg daily.

## 2022-07-04 ENCOUNTER — Ambulatory Visit (INDEPENDENT_AMBULATORY_CARE_PROVIDER_SITE_OTHER): Payer: BC Managed Care – PPO | Admitting: Family Medicine

## 2022-07-04 ENCOUNTER — Encounter: Payer: Self-pay | Admitting: Family Medicine

## 2022-07-04 VITALS — BP 128/82 | HR 66 | Temp 97.8°F | Ht 68.0 in | Wt 193.0 lb

## 2022-07-04 DIAGNOSIS — F339 Major depressive disorder, recurrent, unspecified: Secondary | ICD-10-CM

## 2022-07-04 DIAGNOSIS — Z8639 Personal history of other endocrine, nutritional and metabolic disease: Secondary | ICD-10-CM | POA: Diagnosis not present

## 2022-07-04 DIAGNOSIS — E663 Overweight: Secondary | ICD-10-CM

## 2022-07-04 DIAGNOSIS — Z0000 Encounter for general adult medical examination without abnormal findings: Secondary | ICD-10-CM

## 2022-07-04 LAB — TSH: TSH: 2.36 u[IU]/mL (ref 0.35–5.50)

## 2022-07-04 LAB — T4, FREE: Free T4: 0.71 ng/dL (ref 0.60–1.60)

## 2022-07-04 NOTE — Progress Notes (Signed)
[facility] Sergio Meyers [place] [address] Dept: [phone] Dept Fax: [phone]  Annual Physical Visit  Subjective:    Patient ID: Sergio Meyers Sergio Meyers, male    DOB: 30-Aug-1995, 27 y.o..   MRN: [id number]  Chief Complaint  Patient presents with   Annual Exam    CPE/labs.    History of Present Illness:  Patient is in today for an annual physical/preventative visit.  [name] has a history of Hashimoto's thyroiditis. He has had occasions when he had a flare of the condition, evidenced by tremor. He notes this is doing fairly well at the current time.  Review of Systems  Constitutional:  Negative for chills, diaphoresis, fever, malaise/fatigue and weight loss.  HENT:  Negative for congestion, ear pain, hearing loss, sinus pain, sore throat and tinnitus.   Eyes:  Negative for blurred vision, pain, discharge and redness.  Respiratory:  Negative for cough, shortness of breath and wheezing.   Cardiovascular:  Positive for chest pain. Negative for palpitations.       Notes occasional brief (< 1 min) episodes of chest discomfort that often occurs at rest.  Gastrointestinal:  Negative for abdominal pain, constipation, diarrhea, heartburn, nausea and vomiting.  Musculoskeletal:  Negative for back pain, joint pain and myalgias.  Skin:  Negative for itching and rash.  Psychiatric/Behavioral:  Negative for depression. The patient is not nervous/anxious.    Past Medical History: Patient Active Problem List   Diagnosis Date Noted   COVID-19 05/24/2022   Thyroiditis, transient 09/10/2021   Depression, recurrent ([place]) 01/09/2021   History of Hashimoto thyroiditis 01/09/2021   Past Surgical History:  Procedure Laterality Date   COLONOSCOPY  12/25/2021   VC at [facility], hematochezia   NO PAST SURGERIES     Family History  Problem Relation Age of Onset   Diabetes Father    Hypertension Father    Diabetes Paternal Aunt     Diabetes Paternal Grandfather    Hypertension Paternal Grandfather    CAD Paternal Grandfather    Colon cancer Neg Hx    Esophageal cancer Neg Hx    Outpatient Medications Prior to Visit  Medication Sig Dispense Refill   atenolol (TENORMIN) 50 MG tablet TAKE 1 TABLET BY MOUTH EVERY DAY 90 tablet 3   FLUoxetine (PROZAC) 20 MG tablet Take 1 tablet (20 mg total) by mouth daily. 90 tablet 3   Vitamin D, Cholecalciferol, 50 MCG (2000 UT) CAPS Take 1 capsule by mouth daily.     No facility-administered medications prior to visit.   Allergies  Allergen Reactions   Amoxicillin     Unsure reaction-happened at childbirth   Objective:   Today's Vitals   07/04/22 0852  BP: 128/82  Pulse: 66  Temp: 97.8 F (36.6 C)  TempSrc: Temporal  SpO2: 99%  Weight: 193 lb (87.5 kg)  Height: '5\' 8"'$  (1.727 m)   Body mass index is 29.35 kg/m.   General: Well developed, well nourished. No acute distress. HEENT: Normocephalic, non-traumatic. PERRL, EOMI. Conjunctiva clear. External ears normal. EAC and   TMs normal bilaterally. Nose clear without congestion or rhinorrhea. Mucous membranes moist.   Oropharynx clear. Good dentition. Neck: Supple. No lymphadenopathy. No thyromegaly. Lungs: Clear to auscultation bilaterally. No wheezing, rales or rhonchi. CV: RRR without murmurs or rubs. Pulses 2+ bilaterally. Abdomen: Soft, non-tender. Bowel sounds positive, normal pitch and frequency. No hepatosplenomegaly.   No rebound or guarding. Back: Straight. No CVA tenderness bilaterally. Extremities: Full ROM.  No joint swelling or tenderness. No edema noted. Skin: Warm and dry. No rashes. Psych: Alert and oriented. Normal mood and affect.  There are no preventive care reminders to display for this patient.       07/04/2022   10:08 AM 07/02/2021    9:21 AM 01/09/2021    3:21 PM  Depression screen PHQ 2/9  Decreased Interest 0 0 0  Down, Depressed, Hopeless 0 0 0  PHQ - 2 Score 0 0 0  Altered sleeping  '1 2 3  '$ Tired, decreased energy 0  3  Change in appetite 0 2 0  Feeling bad or failure about yourself  0 0 0  Trouble concentrating 0 0 0  Moving slowly or fidgety/restless 0 0 0  Suicidal thoughts 0 0 0  PHQ-9 Score '1 4 6  '$ Difficult doing work/chores Not difficult at all Not difficult at all Somewhat difficult      07/04/2022   10:08 AM 07/02/2021    9:21 AM 12/08/2019   10:41 AM 11/17/2019   11:51 AM  GAD 7 : Generalized Anxiety Score  Nervous, Anxious, on Edge 0 0 0 1  Control/stop worrying 0 0 0 1  Worry too much - different things 0 0 0 0  Trouble relaxing 0 0 0 0  Restless 0 0 1 2  Easily annoyed or irritable 0 0 0 2  Afraid - awful might happen 0 0 0 0  Total GAD 7 Score 0 0 1 6  Anxiety Difficulty Not difficult at all Not difficult at all Not difficult at all Somewhat difficult   Assessment & Plan:   Problem List Items Addressed This Visit       Other   Depression, recurrent ([place])    Stable. Continue fluoxetine 20 mg daily.      History of Hashimoto thyroiditis    I will recheck thyroid studies.      Relevant Orders   TSH (Completed)   T4, free (Completed)   T3   Overweight (BMI 25.0-29.9)    [name] has gained 13 lbs over the past year. I cautioned him about ongoing weight gain. I recommend he try and incorporate regular exercise into his lifestyle.      Other Visit Diagnoses     Annual physical exam    -  Primary   Overall excellent health. UTD on screenigns and immunizations.       Return in about 1 year (around 07/04/2023) for Annual preventative care.   Haydee Salter, MD

## 2022-07-04 NOTE — Assessment & Plan Note (Signed)
Sergio Meyers has gained 13 lbs over the past year. I cautioned him about ongoing weight gain. I recommend he try and incorporate regular exercise into his lifestyle.

## 2022-07-04 NOTE — Assessment & Plan Note (Signed)
Stable. Continue fluoxetine 20 mg daily.

## 2022-07-04 NOTE — Assessment & Plan Note (Signed)
I will recheck thyroid studies.

## 2022-07-05 LAB — T3: T3, Total: 133 ng/dL (ref 76–181)

## 2022-11-21 DIAGNOSIS — H18222 Idiopathic corneal edema, left eye: Secondary | ICD-10-CM | POA: Diagnosis not present

## 2023-01-18 ENCOUNTER — Encounter: Payer: Self-pay | Admitting: Family Medicine

## 2023-01-20 NOTE — Telephone Encounter (Signed)
Please review and advise. Thanks. Dm/cma

## 2023-04-16 ENCOUNTER — Other Ambulatory Visit: Payer: Self-pay | Admitting: Family Medicine

## 2023-04-16 DIAGNOSIS — E0590 Thyrotoxicosis, unspecified without thyrotoxic crisis or storm: Secondary | ICD-10-CM

## 2023-06-06 ENCOUNTER — Encounter: Payer: Self-pay | Admitting: Family Medicine

## 2023-06-09 ENCOUNTER — Ambulatory Visit: Payer: BC Managed Care – PPO | Admitting: Family Medicine

## 2023-06-09 VITALS — BP 122/74 | HR 57 | Temp 98.2°F | Ht 68.0 in | Wt 189.8 lb

## 2023-06-09 DIAGNOSIS — Z23 Encounter for immunization: Secondary | ICD-10-CM | POA: Diagnosis not present

## 2023-06-09 DIAGNOSIS — Z8639 Personal history of other endocrine, nutritional and metabolic disease: Secondary | ICD-10-CM

## 2023-06-09 DIAGNOSIS — G514 Facial myokymia: Secondary | ICD-10-CM

## 2023-06-09 NOTE — Progress Notes (Signed)
  [facility] [address] [place] [address] Dept: [phone] Dept Fax: [phone]  Office Visit  Subjective:    Patient ID: Sergio Meyers, male    DOB: 01-24-1996, 28 y.o..   MRN: [id number]  Chief Complaint  Patient presents with   Eye Problem    C/o having RT eye twitching x 1 week.    History of Present Illness:  Patient is in today noting an issue with recurrent twitching of a facial muscle near his right eye for about  week. He is under some stress, but not necessarily more than usual. He notes that since scheduling this appointment, his symptoms resolved.  Mr. Malen has a history of Hashimoto's thyroiditis. He has had occasions when he had a flare of the condition, evidenced by tremor. He denies any tremor at the current time.   Past Medical History: Patient Active Problem List   Diagnosis Date Noted   Overweight (BMI 25.0-29.9) 07/04/2022   COVID-19 05/24/2022   Thyroiditis, transient 09/10/2021   Depression, recurrent ([facility]) 01/09/2021   History of Hashimoto thyroiditis 01/09/2021   Past Surgical History:  Procedure Laterality Date   COLONOSCOPY  12/25/2021   VC at [facility], hematochezia   NO PAST SURGERIES     Family History  Problem Relation Age of Onset   Diabetes Father    Hypertension Father    Diabetes Paternal Aunt    Diabetes Paternal Grandfather    Hypertension Paternal Grandfather    CAD Paternal Grandfather    Colon cancer Neg Hx    Esophageal cancer Neg Hx    Outpatient Medications Prior to Visit  Medication Sig Dispense Refill   atenolol  (TENORMIN ) 50 MG tablet TAKE 1 TABLET BY MOUTH EVERY DAY 90 tablet 3   FLUoxetine  (PROZAC ) 20 MG tablet Take 1 tablet (20 mg total) by mouth daily. 90 tablet 3   Vitamin D , Cholecalciferol, 50 MCG (2000 UT) CAPS Take 1 capsule by mouth daily.     No facility-administered medications prior to visit.   Allergies  Allergen Reactions    Amoxicillin     Unsure reaction-happened at childbirth     Objective:   Today's Vitals   06/09/23 1556  BP: 122/74  Pulse: (!) 57  Temp: 98.2 F (36.8 C)  TempSrc: Temporal  SpO2: 99%  Weight: 189 lb 12.8 oz (86.1 kg)  Height: 5\' 8"  (1.727 m)   Body mass index is 28.86 kg/m.   General: Well developed, well nourished. No acute distress. Neuro: No tremor noted. No facial muscle twitching/fasciculations. Psych: Alert and oriented. Normal mood and affect.  Health Maintenance Due  Topic Date Due   INFLUENZA VACCINE  11/28/2022     Assessment & Plan:   Problem List Items Addressed This Visit       Other   History of Hashimoto thyroiditis   I will recheck thyroid  studies, in case this could have resurfaced and contributed to the facial twitching.      Relevant Orders   TSH   T4, free   Other Visit Diagnoses       Facial twitching    -  Primary   Improved. Discussed likely contributors. We will manage expectantly.     Need for immunization against influenza       Relevant Orders   Flu vaccine trivalent PF, 6mos and older(Flulaval,Afluria,Fluarix,Fluzone)      Return if symptoms worsen or fail to improve.   Graig Lawyer, MD

## 2023-06-09 NOTE — Assessment & Plan Note (Signed)
 I will recheck thyroid  studies, in case this could have resurfaced and contributed to the facial twitching.

## 2023-06-10 ENCOUNTER — Encounter: Payer: Self-pay | Admitting: Family Medicine

## 2023-06-10 LAB — T4, FREE: Free T4: 0.9 ng/dL (ref 0.60–1.60)

## 2023-06-10 LAB — TSH: TSH: 2.16 u[IU]/mL (ref 0.35–5.50)

## 2023-06-11 ENCOUNTER — Other Ambulatory Visit: Payer: Self-pay | Admitting: Medical Genetics

## 2023-06-12 ENCOUNTER — Other Ambulatory Visit
Admission: RE | Admit: 2023-06-12 | Discharge: 2023-06-12 | Disposition: A | Discharge status: Home / Self Care | Payer: Self-pay | Source: Ambulatory Visit | Attending: Medical Genetics | Admitting: Medical Genetics

## 2023-06-13 ENCOUNTER — Other Ambulatory Visit: Payer: Self-pay | Admitting: Family Medicine

## 2023-06-13 DIAGNOSIS — F321 Major depressive disorder, single episode, moderate: Secondary | ICD-10-CM

## 2023-06-16 ENCOUNTER — Ambulatory Visit: Payer: BC Managed Care – PPO | Admitting: Family Medicine

## 2023-06-25 LAB — GENECONNECT MOLECULAR SCREEN: Genetic Analysis Overall Interpretation: NEGATIVE

## 2023-06-28 ENCOUNTER — Encounter (INDEPENDENT_AMBULATORY_CARE_PROVIDER_SITE_OTHER): Payer: Self-pay

## 2023-09-25 ENCOUNTER — Encounter: Payer: Self-pay | Admitting: Family Medicine

## 2023-09-25 DIAGNOSIS — F321 Major depressive disorder, single episode, moderate: Secondary | ICD-10-CM

## 2023-09-25 MED ORDER — FLUOXETINE HCL 20 MG PO CAPS: 20.0000 mg | ORAL_CAPSULE | Freq: Every day | ORAL | 1 refills | Status: DC

## 2023-09-25 NOTE — Telephone Encounter (Signed)
 Patient notified VIA phone. Dm/cma

## 2023-09-25 NOTE — Telephone Encounter (Signed)
 Copied from CRM [id number]. Topic: Clinical - Prescription Issue >> Sep 25, 2023  1:58 PM Peg Bouton F wrote: Reason for CRM: Pt states that he called the pharmacy to get his FLUoxetine  (PROZAC ) 20 MG tablet refilled and they told him that the insurance would no longer cover the tablet form and would like to know what options he has now.  Please call patient back at [phone] and ok to leave vm.

## 2023-09-25 NOTE — Addendum Note (Signed)
 Addended by: Gavin Kast on: 09/25/2023 02:26 PM   Modules accepted: Orders

## 2024-01-03 ENCOUNTER — Encounter: Payer: Self-pay | Admitting: Family Medicine

## 2024-01-09 ENCOUNTER — Ambulatory Visit: Admitting: Medical

## 2024-01-09 ENCOUNTER — Encounter: Payer: Self-pay | Admitting: Medical

## 2024-01-09 VITALS — BP 120/80 | HR 66 | Temp 98.1°F | Resp 17 | Ht 68.0 in | Wt 188.8 lb

## 2024-01-09 DIAGNOSIS — K921 Melena: Secondary | ICD-10-CM

## 2024-01-09 DIAGNOSIS — K648 Other hemorrhoids: Secondary | ICD-10-CM | POA: Diagnosis not present

## 2024-01-09 DIAGNOSIS — R195 Other fecal abnormalities: Secondary | ICD-10-CM | POA: Diagnosis not present

## 2024-01-09 MED ORDER — HYDROCORTISONE ACETATE 25 MG RE SUPP: 25.0000 mg | Freq: Two times a day (BID) | RECTAL | 0 refills | Status: AC | PRN

## 2024-01-09 NOTE — Addendum Note (Signed)
 Addended by: TRUDY CURVIN RAMAN on: 01/09/2024 03:19 PM   Modules accepted: Orders

## 2024-01-09 NOTE — Progress Notes (Signed)
 Subjective:    Patient ID: Sergio Meyers, male    DOB: Nov 05, 1995, 28 y.o.   MRN: [id number]  HPI  Sergio Meyers is a 28 year old male with a history of hemorrhoids who presents with blood in his stool.  He noticed blood in his stool starting on Monday, describing it as 'half stool, half blood' with clots. The bleeding occurs intermittently and is noted both in the stool and when wiping. The bleeding seems to be slowly dissipating.  He has a history of hemorrhoids, confirmed by a colonoscopy in 2023, which identified non-bleeding internal hemorrhoids. He was treated with suppositories at that time, which helped alleviate itching. He denies feeling any external lumps or significant pain during bowel movements, although he does experience occasional rectal itching.  His bowel movements were normal before Monday, but he experienced a change to looser stools starting then, with diarrhea on Wednesday. Since then, he has had mostly gas and a decreased appetite. No constipation and no significant change in fatigue levels, attributing any fatigue to his early morning work schedule.  He acknowledges a decrease in dietary fiber intake recently. He has not been using any current medications for his symptoms.       Review of Systems  Constitutional:  Negative for chills, fatigue and fever.  HENT:  Negative for congestion and ear pain.   Respiratory:  Negative for cough, choking and wheezing.   Cardiovascular:  Negative for chest pain and palpitations.  Gastrointestinal:  Positive for blood in stool. Negative for abdominal pain, constipation, nausea and vomiting.       Hematochezia. Loose stools.  Genitourinary:  Negative for dysuria, frequency and penile pain.  Musculoskeletal:  Negative for back pain, myalgias and neck stiffness.  Skin:  Negative for rash.  Neurological:  Negative for dizziness, weakness, light-headedness and numbness.  Hematological:  Negative for  adenopathy.  Psychiatric/Behavioral:  Negative for behavioral problems, dysphoric mood and hallucinations.     Past Medical History:  Diagnosis Date   Depression      Social History   Socioeconomic History   Marital status: Single    Spouse name: Not on file   Number of children: 0   Years of education: Not on file   Highest education level: Bachelor's degree (e.g., BA, AB, BS)  Occupational History   Occupation: Merchandiser, retail    Comment: [employer]  Tobacco Use   Smoking status: Never   Smokeless tobacco: Never  Vaping Use   Vaping status: Never Used  Substance and Sexual Activity   Alcohol use: Yes    Comment: rare   Drug use: Never   Sexual activity: Yes  Other Topics Concern   Not on file  Social History Narrative   Not on file   Social Drivers of Health   Financial Resource Strain: Low Risk  (01/08/2024)   Overall Financial Resource Strain (CARDIA)    Difficulty of Paying Living Expenses: Not hard at all  Food Insecurity: No Food Insecurity (01/08/2024)   Hunger Vital Sign    Worried About Running Out of Food in the Last Year: Never true    Ran Out of Food in the Last Year: Never true  Transportation Needs: No Transportation Needs (01/08/2024)   PRAPARE - Administrator, Civil Service (Medical): No    Lack of Transportation (Non-Medical): No  Physical Activity: Insufficiently Active (01/08/2024)   Exercise Vital Sign    Days of Exercise per Week: 3 days  Minutes of Exercise per Session: 40 min  Stress: No Stress Concern Present (01/08/2024)   [employer] of Occupational Health - Occupational Stress Questionnaire    Feeling of Stress: Not at all  Social Connections: Moderately Isolated (01/08/2024)   Social Connection and Isolation Panel    Frequency of Communication with Friends and Family: More than three times a week    Frequency of Social Gatherings with Friends and Family: Twice a week    Attends Religious Services: Never    Doctor, general practice or Organizations: Yes    Attends Banker Meetings: Never    Marital Status: Never married  Intimate Partner Violence: Not on file    Past Surgical History:  Procedure Laterality Date   COLONOSCOPY  12/25/2021   VC at [facility], hematochezia   NO PAST SURGERIES      Family History  Problem Relation Age of Onset   Diabetes Father    Hypertension Father    Diabetes Paternal Aunt    Diabetes Paternal Grandfather    Hypertension Paternal Grandfather    CAD Paternal Grandfather    Colon cancer Neg Hx    Esophageal cancer Neg Hx     Allergies  Allergen Reactions   Amoxicillin     Unsure reaction-happened at childbirth    Current Outpatient Medications on File Prior to Visit  Medication Sig Dispense Refill   atenolol  (TENORMIN ) 50 MG tablet TAKE 1 TABLET BY MOUTH EVERY DAY 90 tablet 3   FLUoxetine  (PROZAC ) 20 MG capsule Take 1 capsule (20 mg total) by mouth daily. 90 capsule 1   Vitamin D , Cholecalciferol, 50 MCG (2000 UT) CAPS Take 1 capsule by mouth daily.     No current facility-administered medications on file prior to visit.    BP 120/80   Pulse 66   Temp 98.1 F (36.7 C) (Oral)   Resp 17   Ht 5' 8 (1.727 m)   Wt 188 lb 12.8 oz (85.6 kg)   SpO2 99%   BMI 28.71 kg/m          Objective:   Physical Exam  General Mental Status- Alert. General Appearance- Not in acute distress.   Skin General: Color- Normal Color. Moisture- Normal Moisture.  Neck No JVD.  Chest and Lung Exam Auscultation: Breath Sounds:-CTA  Cardiovascular Auscultation:Rythm- RRR Murmurs & Other Heart Sounds:Auscultation of the heart reveals- No Murmurs.  Abdomen Inspection:-Inspeection Normal. Palpation/Percussion:Note:No mass. Palpation and Percussion of the abdomen reveal- Non Tender, Non Distended + BS, no rebound or guarding.   Neurologic Cranial Nerve exam:- CN III-XII intact(No nystagmus), symmetric smile. Strength:- 5/5 equal and symmetric strength both  upper and lower extremities.   Rectal- on inspection. No external hermorrhoids seen.      Assessment & Plan:   Patient Instructions  Hematochezia with internal Hemorrhoids with rectal bleeding Intermittent rectal bleeding with internal hemorrhoids confirmed by colonoscopy. Symptoms include itching and pain during bowel movements. Atypical presentation without constipation. - Refer to Dr. San, GI specialist, for further evaluation. - Advise high fiber diet and increased hydration. - Prescribe suppositories if rectal itching or external hemorrhoids develop.  Acute diarrhea Acute watery diarrhea since Wednesday, atypical for hemorrhoid flare-ups, suggesting possible gastrointestinal issue. - Provide stool culture kit, ovum parasite kit, and C. difficile test kit for collection if diarrhea persists. - Advise bland diet and increased fiber intake. - Instruct to submit stool tests if diarrhea continues by Monday.  Follow up date to be determined/as needed prior to  referral to GI appointment.   Donnamae Muilenburg, PA-C

## 2024-01-09 NOTE — Patient Instructions (Signed)
 Hematochezia with internal Hemorrhoids with rectal bleeding Intermittent rectal bleeding with internal hemorrhoids confirmed by colonoscopy. Symptoms include itching and pain during bowel movements. Atypical presentation without constipation. - Refer to Dr. San, GI specialist, for further evaluation. - Advise high fiber diet and increased hydration. - Prescribe suppositories if rectal itching or external hemorrhoids develop.  Acute diarrhea Acute watery diarrhea since Wednesday, atypical for hemorrhoid flare-ups, suggesting possible gastrointestinal issue. - Provide stool culture kit, ovum parasite kit, and C. difficile test kit for collection if diarrhea persists. - Advise bland diet and increased fiber intake. - Instruct to submit stool tests if diarrhea continues by Monday.  Follow up date to be determined/as needed prior to referral to GI appointment.

## 2024-01-12 ENCOUNTER — Other Ambulatory Visit

## 2024-01-12 DIAGNOSIS — R195 Other fecal abnormalities: Secondary | ICD-10-CM

## 2024-01-13 ENCOUNTER — Ambulatory Visit: Payer: Self-pay | Admitting: Medical

## 2024-01-13 LAB — CLOSTRIDIUM DIFFICILE BY PCR: Toxigenic C. Difficile by PCR: NEGATIVE

## 2024-01-15 LAB — OVA AND PARASITE EXAMINATION
CONCENTRATE RESULT:: NONE SEEN
MICRO NUMBER:: 16968763
SPECIMEN QUALITY:: ADEQUATE
TRICHROME RESULT:: NONE SEEN

## 2024-01-17 LAB — STOOL CULTURE: E coli, Shiga toxin Assay: NEGATIVE

## 2024-01-19 ENCOUNTER — Encounter: Payer: Self-pay | Admitting: Gastroenterology

## 2024-01-19 ENCOUNTER — Ambulatory Visit: Admitting: Gastroenterology

## 2024-01-19 VITALS — BP 116/72 | HR 86 | Ht 68.0 in | Wt 182.0 lb

## 2024-01-19 DIAGNOSIS — K921 Melena: Secondary | ICD-10-CM

## 2024-01-19 DIAGNOSIS — K640 First degree hemorrhoids: Secondary | ICD-10-CM | POA: Diagnosis not present

## 2024-01-19 DIAGNOSIS — K648 Other hemorrhoids: Secondary | ICD-10-CM

## 2024-01-19 NOTE — Progress Notes (Signed)
 Chief Complaint:    Internal hemorrhoids, hematochezia   HPI:     Patient is a 28 y.o. male presenting to the Gastroenterology Clinic for evaluation of hematochezia.  He was seen for similar symptoms in 11/2019 and diagnosed with internal hemorrhoids on anoscopy and patient elected for conservative management with trial of Anusol  suppositories, fiber, hydration.  Had follow-up in 11/2021 for recurrence of hemorrhoidal symptoms, again responsive to Anusol  suppositories.  Had recommended colonoscopy, completed on 12/25/2021 and notable for small grade 1 internal hemorrhoids, otherwise normal colon (biopsies negative for microscopic colitis) and normal ileum. Recall colonoscopy at age 10.   Was seen by PCP earlier this month for recurrence of episodic BRBPR.  Negative/normal stool culture, C. difficile, ova and parasite, and was referred back to the GI clinic.  Today, he states that he continues to have intermittent small-volume BRBPR and blood clots. Last episode was 2 weeks ago. Can have dyschezia at times.  Did not try any medications OTC, etc this time. Otherwise no diarrhea, constipation, abdominal pain, or other GI symptoms. No UGI sxs.     Review of systems:     No chest pain, no SOB, no fevers, no urinary sx   Past Medical History:  Diagnosis Date   Depression     Patient's surgical history, family medical history, social history, medications and allergies were all reviewed in Epic    Current Outpatient Medications  Medication Sig Dispense Refill   atenolol  (TENORMIN ) 50 MG tablet TAKE 1 TABLET BY MOUTH EVERY DAY 90 tablet 3   FLUoxetine  (PROZAC ) 20 MG capsule Take 1 capsule (20 mg total) by mouth daily. 90 capsule 1   hydrocortisone  (ANUSOL -HC) 25 MG suppository Place 1 suppository (25 mg total) rectally 2 (two) times daily as needed for hemorrhoids or anal itching. 14 suppository 0   Vitamin D , Cholecalciferol, 50 MCG (2000 UT) CAPS Take 1 capsule by mouth daily.     No  current facility-administered medications for this visit.    Physical Exam:     BP 116/72   Pulse 86   Ht 5' 8 (1.727 m)   Wt 182 lb (82.6 kg)   BMI 27.67 kg/m   GENERAL:  Pleasant male in NAD PSYCH: : Cooperative, normal affect Musculoskeletal:  Normal muscle tone, normal strength NEURO: Alert and oriented x 3, no focal neurologic deficits Rectal exam: Sensation intact and preserved anal wink. No external anal fissures, external hemorrhoids or skin tags. Normal sphincter tone. No palpable mass. No blood on the exam glove.  Anoscopy with small grade 1 hemorrhoids in RP and LL columns.  (Chaperone: Nat Sic, CMA).     IMPRESSION and PLAN:    1) Hematochezia 2) Internal hemorrhoids PROCEDURE NOTE: The patient presents with symptomatic grade 1  hemorrhoids, unresponsive to maximal medical therapy, requesting rubber band ligation of symptomatic hemorrhoidal disease.  All risks, benefits and alternative forms of therapy were described and informed consent was obtained.  In the Left Lateral Decubitus position, anoscopic examination revealed grade 1 hemorrhoids in the LL and RP position(s).  The anorectum was pre-medicated with RectiCare. The decision was made to band the LL internal hemorrhoid, and the [facility] System was used to perform band ligation without complication.  Digital anorectal examination was then performed to assure proper positioning of the band, and to adjust the banded tissue as required.  The patient was discharged home without pain or other issues.  Dietary and behavioral recommendations were given and along with follow-up instructions.  The following adjunctive treatments were recommended:  -Resume high-fiber diet with fiber supplement (i.e. Citrucel or Benefiber) with goal for soft stools without straining to have a BM. -Resume adequate fluid intake.  The patient will return in 4+ weeks for follow-up and possible additional banding as required. No  complications were encountered and the patient tolerated the procedure well.           Sergio Meyers ,DO, FACG 01/19/2024, 9:27 AM

## 2024-01-19 NOTE — Patient Instructions (Signed)
 _______________________________________________________  If your blood pressure at your visit was 140/90 or greater, please contact your primary care physician to follow up on this.  _______________________________________________________  If you are age 29 or older, your body mass index should be between 23-30. Your Body mass index is 27.67 kg/m. If this is out of the aforementioned range listed, please consider follow up with your Primary Care Provider.  If you are age 78 or younger, your body mass index should be between 19-25. Your Body mass index is 27.67 kg/m. If this is out of the aformentioned range listed, please consider follow up with your Primary Care Provider.   ________________________________________________________  The [facility] providers would like to encourage you to use MYCHART to communicate with providers for non-urgent requests or questions.  Due to long hold times on the telephone, sending your provider a message by [facility] may be a faster and more efficient way to get a response.  Please allow 48 business hours for a response.  Please remember that this is for non-urgent requests.  _______________________________________________________  [facility] is using a team-based approach to care.  Your team is made up of your doctor and two to three APPS. Our APPS (Nurse Practitioners and Physician Assistants) work with your physician to ensure care continuity for you. They are fully qualified to address your health concerns and develop a treatment plan. They communicate directly with your gastroenterologist to care for you. Seeing the Advanced Practice Practitioners on your physician's team can help you by facilitating care more promptly, often allowing for earlier appointments, access to diagnostic testing, procedures, and other specialty referrals.   HEMORRHOID BANDING PROCEDURE    FOLLOW-UP CARE   The procedure you have had should have been relatively  painless since the banding of the area involved does not have nerve endings and there is no pain sensation.  The rubber band cuts off the blood supply to the hemorrhoid and the band may fall off as soon as 48 hours after the banding (the band may occasionally be seen in the toilet bowl following a bowel movement). You may notice a temporary feeling of fullness in the rectum which should respond adequately to plain Tylenol or Motrin.  Following the banding, avoid strenuous exercise that evening and resume full activity the next day.  A sitz bath (soaking in a warm tub) or bidet is soothing, and can be useful for cleansing the area after bowel movements.     To avoid constipation, take two tablespoons of natural wheat bran, natural oat bran, flax, Benefiber or any over the counter fiber supplement and increase your water intake to 7-8 glasses daily.    Unless you have been prescribed anorectal medication, do not put anything inside your rectum for two weeks: No suppositories, enemas, fingers, etc.  Occasionally, you may have more bleeding than usual after the banding procedure.  This is often from the untreated hemorrhoids rather than the treated one.  Don't be concerned if there is a tablespoon or so of blood.  If there is more blood than this, lie flat with your bottom higher than your head and apply an ice pack to the area. If the bleeding does not stop within a half an hour or if you feel faint, call our office at [phone] or go to the emergency room.  Problems are not common; however, if there is a substantial amount of bleeding, severe pain, chills, fever or difficulty passing urine (very rare) or other problems, you should  call us  at [phone] or report to the nearest emergency room.  Do not stay seated continuously for more than 2-3 hours for a day or two after the procedure.  Tighten your buttock muscles 10-15 times every two hours and take 10-15 deep breaths every 1-2 hours.  Do  not spend more than a few minutes on the toilet if you cannot empty your bowel; instead re-visit the toilet at a later time.   It was a pleasure to see you today!  Vito Cirigliano, D.O.

## 2024-02-04 ENCOUNTER — Ambulatory Visit: Admitting: Family Medicine

## 2024-02-24 ENCOUNTER — Encounter: Admitting: Gastroenterology

## 2024-03-07 ENCOUNTER — Other Ambulatory Visit: Payer: Self-pay | Admitting: Internal Medicine

## 2024-03-08 NOTE — Telephone Encounter (Signed)
 Requesting: FLUOXETINE  HCL 20 MG CAPSULE  Last Visit: 06/09/2023 Next Visit: Visit date not found Last Refill: 09/25/2023  Please Advise

## 2024-03-08 NOTE — Telephone Encounter (Signed)
 Left VM to rtn call to schedule a f/u appointment.  Dm/cma

## 2024-03-26 ENCOUNTER — Other Ambulatory Visit: Payer: Self-pay | Admitting: Family Medicine

## 2024-03-26 DIAGNOSIS — E0590 Thyrotoxicosis, unspecified without thyrotoxic crisis or storm: Secondary | ICD-10-CM

## 2024-05-26 ENCOUNTER — Ambulatory Visit (INDEPENDENT_AMBULATORY_CARE_PROVIDER_SITE_OTHER): Admitting: Family Medicine

## 2024-05-26 VITALS — BP 122/74 | HR 64 | Temp 98.7°F | Ht 68.0 in | Wt 186.8 lb

## 2024-05-26 DIAGNOSIS — E663 Overweight: Secondary | ICD-10-CM | POA: Diagnosis not present

## 2024-05-26 DIAGNOSIS — Z0000 Encounter for general adult medical examination without abnormal findings: Secondary | ICD-10-CM | POA: Diagnosis not present

## 2024-05-26 DIAGNOSIS — Z8639 Personal history of other endocrine, nutritional and metabolic disease: Secondary | ICD-10-CM | POA: Diagnosis not present

## 2024-05-26 DIAGNOSIS — F339 Major depressive disorder, recurrent, unspecified: Secondary | ICD-10-CM

## 2024-05-26 NOTE — Assessment & Plan Note (Signed)
 Overall health is very good. Recommend ongoing regular exercise. Discussed recommended screenings and immunizations.

## 2024-05-26 NOTE — Assessment & Plan Note (Signed)
I will recheck thyroid studies.

## 2024-05-26 NOTE — Assessment & Plan Note (Signed)
 Stable. Continue fluoxetine 20 mg daily.

## 2024-05-26 NOTE — Progress Notes (Signed)
 " [facility] PRIMARY [facility]ARE [facility] [address] [place] [address] Dept: [phone] Dept Fax: [phone]  Annual Physical Visit  Subjective:    Patient ID: Sergio Meyers, male    DOB: Nov 29, 1995, 29 y.o..   MRN: [id number]  Chief Complaint  Patient presents with   Annual Exam    CPE/labs.  No concerns.    History of Present Illness:  Patient is in today for an annual physical/preventative visit.  Sergio Meyers has a history of Hashimoto's thyroiditis. He has had occasions when he had a flare of the condition, evidenced by tremor. He notes this is doing well at the current time.  Sergio Meyers has started a workout routine with a trainer. This involves more weight training than cardio. He deos feel very positive about this change.  Review of Systems  Constitutional:  Negative for chills, diaphoresis, fever, malaise/fatigue and weight loss.  HENT:  Negative for congestion, ear pain, hearing loss, sinus pain, sore throat and tinnitus.   Eyes:  Negative for blurred vision, pain, discharge and redness.  Respiratory:  Negative for cough, shortness of breath and wheezing.   Cardiovascular:  Negative for chest pain and palpitations.  Gastrointestinal:  Negative for abdominal pain, constipation, diarrhea, heartburn, nausea and vomiting.  Musculoskeletal:  Negative for back pain, joint pain and myalgias.  Skin:  Negative for itching and rash.  Psychiatric/Behavioral:  Negative for depression. The patient is not nervous/anxious.     Past Medical History: Patient Active Problem List   Diagnosis Date Noted   Overweight (BMI 25.0-29.9) 07/04/2022   COVID-19 05/24/2022   Thyroiditis, transient 09/10/2021   Depression, recurrent 01/09/2021   History of Hashimoto thyroiditis 01/09/2021   Past Surgical History:  Procedure Laterality Date   COLONOSCOPY  12/25/2021   VC at [facility], hematochezia   NO PAST SURGERIES     Family History  Problem  Relation Age of Onset   Diabetes Father    Hypertension Father    Diabetes Paternal Aunt    Diabetes Paternal Grandfather    Hypertension Paternal Grandfather    CAD Paternal Grandfather    Colon cancer Neg Hx    Esophageal cancer Neg Hx    Outpatient Medications Prior to Visit  Medication Sig Dispense Refill   atenolol  (TENORMIN ) 50 MG tablet TAKE 1 TABLET BY MOUTH EVERY DAY 90 tablet 3   FLUoxetine  (PROZAC ) 20 MG capsule TAKE 1 CAPSULE BY MOUTH EVERY DAY 90 capsule 1   hydrocortisone  (ANUSOL -HC) 25 MG suppository Place 1 suppository (25 mg total) rectally 2 (two) times daily as needed for hemorrhoids or anal itching. 14 suppository 0   Vitamin D , Cholecalciferol, 50 MCG (2000 UT) CAPS Take 1 capsule by mouth daily.     No facility-administered medications prior to visit.   Allergies[1] Objective:   Today's Vitals   05/26/24 1310  BP: 122/74  Pulse: 64  Temp: 98.7 F (37.1 C)  TempSrc: Temporal  SpO2: 99%  Weight: 186 lb 12.8 oz (84.7 kg)  Height: 5' 8 (1.727 m)   Body mass index is 28.4 kg/m.   General: Well developed, well nourished. No acute distress. HEENT: Normocephalic, non-traumatic. PERRL, EOMI. Conjunctiva clear. External ears normal. EAC and TMs normal   bilaterally. Nose clear without congestion or rhinorrhea. Mucous membranes moist. Oropharynx clear. Good dentition. Neck: Supple. No lymphadenopathy. No thyromegaly. Lungs: Clear to auscultation bilaterally. No wheezing, rales or rhonchi. CV: RRR without murmurs or rubs. Pulses 2+ bilaterally. Abdomen: Soft, non-tender. Bowel  sounds positive, normal pitch and frequency. No hepatosplenomegaly. No rebound or   guarding. Extremities: Full ROM. No joint swelling or tenderness. No edema noted. Skin: Warm and dry. No rashes. Psych: Alert and oriented. Normal mood and affect.  Health Maintenance Due  Topic Date Due   COVID-19 Vaccine (3 - 2025-26 season) 12/29/2023   Lab Results Lab Results  Component Value  Date   TSH 2.16 06/09/2023   Assessment & Plan:   Problem List Items Addressed This Visit       Other   Annual physical exam - Primary   Overall health is very good. Recommend ongoing regular exercise. Discussed recommended screenings and immunizations.       Depression, recurrent   Stable. Continue fluoxetine  20 mg daily.      History of Hashimoto thyroiditis   I will recheck thyroid  studies.      Relevant Orders   TSH   T4, free   Overweight (BMI 25.0-29.9)   Sergio Meyers has started an exercise routine. I encouraged him to strike a good balance between weight training and cardio. I recommend he look at incorporating aspects of a Mediterranean diet as well.       Return in about 1 year (around 05/26/2025) for Annual preventative care.   Garnette CHRISTELLA Simpler, MD  I,Emily Lagle,acting as a scribe for Garnette CHRISTELLA Simpler, MD.,have documented all relevant documentation on the behalf of Garnette CHRISTELLA Simpler, MD.  I, Garnette CHRISTELLA Simpler, MD, have reviewed all documentation for this visit. The documentation on 05/26/2024 for the exam, diagnosis, procedures, and orders are all accurate and complete.      [1]  Allergies Allergen Reactions   Amoxicillin     Unsure reaction-happened at childbirth   "

## 2024-05-26 NOTE — Assessment & Plan Note (Addendum)
 Sergio Meyers has started an exercise routine. I encouraged him to strike a good balance between weight training and cardio. I recommend he look at incorporating aspects of a Mediterranean diet as well.

## 2024-05-27 ENCOUNTER — Ambulatory Visit: Payer: Self-pay | Admitting: Family Medicine

## 2024-05-27 LAB — T4, FREE: Free T4: 0.73 ng/dL (ref 0.60–1.60)

## 2024-05-27 LAB — TSH: TSH: 0.96 u[IU]/mL (ref 0.35–5.50)
